# Patient Record
Sex: Female | Born: 1990
Health system: Southern US, Community
[De-identification: ages and names within clinical notes are randomized; demographics above are authoritative.]

## PROBLEM LIST (undated history)

## (undated) DIAGNOSIS — U071 COVID-19: Secondary | ICD-10-CM

## (undated) DIAGNOSIS — J1282 Pneumonia due to coronavirus disease 2019: Secondary | ICD-10-CM

## (undated) DIAGNOSIS — R011 Cardiac murmur, unspecified: Secondary | ICD-10-CM

## (undated) DIAGNOSIS — T781XXA Other adverse food reactions, not elsewhere classified, initial encounter: Secondary | ICD-10-CM

## (undated) DIAGNOSIS — J45909 Unspecified asthma, uncomplicated: Secondary | ICD-10-CM

## (undated) DIAGNOSIS — T7840XA Allergy, unspecified, initial encounter: Secondary | ICD-10-CM

## (undated) HISTORY — DX: Other adverse food reactions, not elsewhere classified, initial encounter: T78.1XXA

## (undated) HISTORY — DX: Cardiac murmur, unspecified: R01.1

## (undated) HISTORY — DX: Unspecified asthma, uncomplicated: J45.909

## (undated) HISTORY — PX: TONSILLECTOMY: SUR1361

## (undated) HISTORY — DX: Allergy, unspecified, initial encounter: T78.40XA

---
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
## 2011-01-30 LAB — COMPREHENSIVE METABOLIC PANEL
ALT: 11
AST: 23
Albumin: 3.9
Alkaline Phosphatase: 51
BUN: 7
CO2: 23
Calcium: 9.1
Chloride: 102
Creatinine, Ser: 0.83
Glucose, Bld: 94
Potassium: 3 — ABNORMAL LOW
Sodium: 135
Total Bilirubin: 0.4
Total Protein: 7.4

## 2011-01-30 LAB — URINE MICROSCOPIC-ADD ON

## 2011-01-30 LAB — URINALYSIS, ROUTINE W REFLEX MICROSCOPIC
Bilirubin Urine: NEGATIVE
Glucose, UA: NEGATIVE
Ketones, ur: 15 — AB
Leukocytes, UA: NEGATIVE
Nitrite: NEGATIVE
Protein, ur: NEGATIVE
Specific Gravity, Urine: 1.033 — ABNORMAL HIGH
Urobilinogen, UA: 0.2
pH: 5.5

## 2011-01-30 LAB — URINE CULTURE
Colony Count: NO GROWTH
Culture: NO GROWTH

## 2011-01-30 LAB — PREGNANCY, URINE: Preg Test, Ur: NEGATIVE

## 2011-01-30 LAB — LIPASE, BLOOD: Lipase: 16

## 2015-12-21 DIAGNOSIS — Z23 Encounter for immunization: Secondary | ICD-10-CM | POA: Diagnosis not present

## 2015-12-22 DIAGNOSIS — Z23 Encounter for immunization: Secondary | ICD-10-CM | POA: Diagnosis not present

## 2016-01-04 DIAGNOSIS — Z111 Encounter for screening for respiratory tuberculosis: Secondary | ICD-10-CM | POA: Diagnosis not present

## 2016-01-20 DIAGNOSIS — Z23 Encounter for immunization: Secondary | ICD-10-CM | POA: Diagnosis not present

## 2016-03-05 DIAGNOSIS — H903 Sensorineural hearing loss, bilateral: Secondary | ICD-10-CM | POA: Diagnosis not present

## 2016-03-05 DIAGNOSIS — S0300XA Dislocation of jaw, unspecified side, initial encounter: Secondary | ICD-10-CM | POA: Diagnosis not present

## 2016-03-05 DIAGNOSIS — H6983 Other specified disorders of Eustachian tube, bilateral: Secondary | ICD-10-CM | POA: Diagnosis not present

## 2016-03-07 DIAGNOSIS — Z23 Encounter for immunization: Secondary | ICD-10-CM | POA: Diagnosis not present

## 2016-04-06 DIAGNOSIS — J018 Other acute sinusitis: Secondary | ICD-10-CM | POA: Diagnosis not present

## 2016-04-25 DIAGNOSIS — J310 Chronic rhinitis: Secondary | ICD-10-CM | POA: Diagnosis not present

## 2016-05-11 ENCOUNTER — Ambulatory Visit: Payer: Self-pay | Admitting: Pediatrics

## 2016-05-17 ENCOUNTER — Ambulatory Visit: Payer: Self-pay | Admitting: Pediatrics

## 2016-06-19 DIAGNOSIS — J40 Bronchitis, not specified as acute or chronic: Secondary | ICD-10-CM | POA: Diagnosis not present

## 2017-03-04 DIAGNOSIS — R05 Cough: Secondary | ICD-10-CM | POA: Diagnosis not present

## 2017-03-04 DIAGNOSIS — J40 Bronchitis, not specified as acute or chronic: Secondary | ICD-10-CM | POA: Diagnosis not present

## 2017-03-04 DIAGNOSIS — Z6838 Body mass index (BMI) 38.0-38.9, adult: Secondary | ICD-10-CM | POA: Diagnosis not present

## 2017-06-15 DIAGNOSIS — Z91018 Allergy to other foods: Secondary | ICD-10-CM | POA: Diagnosis not present

## 2017-06-15 DIAGNOSIS — L500 Allergic urticaria: Secondary | ICD-10-CM | POA: Diagnosis not present

## 2017-06-27 ENCOUNTER — Ambulatory Visit: Payer: BLUE CROSS/BLUE SHIELD | Admitting: Physician Assistant

## 2017-06-27 ENCOUNTER — Encounter: Payer: Self-pay | Admitting: Physician Assistant

## 2017-06-27 VITALS — BP 139/89 | HR 69 | Temp 98.5°F | Ht 66.0 in | Wt 253.8 lb

## 2017-06-27 DIAGNOSIS — I499 Cardiac arrhythmia, unspecified: Secondary | ICD-10-CM | POA: Diagnosis not present

## 2017-06-27 DIAGNOSIS — T7840XA Allergy, unspecified, initial encounter: Secondary | ICD-10-CM

## 2017-06-27 DIAGNOSIS — Z0000 Encounter for general adult medical examination without abnormal findings: Secondary | ICD-10-CM | POA: Diagnosis not present

## 2017-06-27 DIAGNOSIS — H9190 Unspecified hearing loss, unspecified ear: Secondary | ICD-10-CM | POA: Insufficient documentation

## 2017-06-27 DIAGNOSIS — R002 Palpitations: Secondary | ICD-10-CM

## 2017-06-27 NOTE — Progress Notes (Signed)
BP 139/89   Pulse 69   Temp 98.5 F (36.9 C) (Oral)   Ht 5' 6"  (1.676 m)   Wt 253 lb 12.8 oz (115.1 kg)   LMP 05/27/2017   BMI 40.96 kg/m    Subjective:    Patient ID: Joan Ball, female    DOB: 11/23/1990, 27 y.o.   MRN: [id number]  HPI: Joan Ball is a 27 y.o. female presenting on 06/27/2017 for New Patient (Initial Visit) and Establish Care  Patient comes in as a new patient to be seen today.  She has a couple of medical issues that she is concerned about.  She most recently had a severe allergic reaction to peaches.  She has never been to the allergist to be tested.  But over the year she has found herself to have allergic sensitivities when stung fruit touches her or when she ingested.  This time peach cobbler was being bagged and other than with other bread.  She ate the bread and not the cobbler.  Approximately 3 hours later she had severe hives throughout her body.  She experiences some slight wheezing.  She took Benadryl at her home.  She then went to [facility] in [place].  This was on February 15.    She is also having a complaint of occasional palpitations.  She has never been evaluated by cardiology before.  She has had slightly elevated blood pressure in the past but never had to be treated.  She does have a strong family history of diabetes and heart disease.  Past Medical History:  Diagnosis Date  . Allergy   . Heart murmur   . Hypertension    Relevant past medical, surgical, family and social history reviewed and updated as indicated. Interim medical history since our last visit reviewed. Allergies and medications reviewed and updated. DATA REVIEWED: CHART IN EPIC  Family History reviewed for pertinent findings.  Review of Systems  Constitutional: Negative.  Negative for activity change, fatigue and fever.  HENT: Negative.   Eyes: Negative.   Respiratory: Negative.  Negative for cough.   Cardiovascular: Negative.  Negative for  chest pain.  Gastrointestinal: Negative.  Negative for abdominal pain.  Endocrine: Negative.   Genitourinary: Negative.  Negative for dysuria.  Musculoskeletal: Negative.   Skin: Negative.   Neurological: Negative.     Allergies as of 06/27/2017      Reactions   Other    Peaches    Peach [prunus Persica]    Amoxicillin Rash   Other Other   Penicillin G Rash   Other Other      Medication List    as of 06/27/2017  2:38 PM   You have not been prescribed any medications.        Objective:    BP 139/89   Pulse 69   Temp 98.5 F (36.9 C) (Oral)   Ht 5' 6"  (1.676 m)   Wt 253 lb 12.8 oz (115.1 kg)   LMP 05/27/2017   BMI 40.96 kg/m   Allergies  Allergen Reactions  . Other     Peaches   . Peach [Prunus Persica]   . Amoxicillin Rash    Other Other   . Penicillin G Rash    Other Other     Wt Readings from Last 3 Encounters:  06/27/17 253 lb 12.8 oz (115.1 kg)    Physical Exam  Constitutional: She is oriented to person, place, and time. She appears well-developed and well-nourished.  HENT:  Head: Normocephalic and atraumatic.  Right Ear: Tympanic membrane, external ear and ear canal normal.  Left Ear: Tympanic membrane, external ear and ear canal normal.  Nose: Nose normal. No rhinorrhea.  Mouth/Throat: Oropharynx is clear and moist and mucous membranes are normal. No oropharyngeal exudate or posterior oropharyngeal erythema.  Eyes: Conjunctivae and EOM are normal. Pupils are equal, round, and reactive to light.  Neck: Normal range of motion. Neck supple.  Cardiovascular: Normal rate, regular rhythm, normal heart sounds and intact distal pulses.  Sinus arrhythmia   Pulmonary/Chest: Effort normal and breath sounds normal.  Abdominal: Soft. Bowel sounds are normal.  Neurological: She is alert and oriented to person, place, and time. She has normal reflexes.  Skin: Skin is warm and dry. No rash noted.  Psychiatric: She has a normal mood and affect. Her  behavior is normal. Judgment and thought content normal.  Nursing note and vitals reviewed.   Results for orders placed or performed during the hospital encounter of 02/08/08  Urine culture  Result Value Ref Range   Specimen Description URINE, CLEAN CATCH    Special Requests NONE    Colony Count NO GROWTH    Culture NO GROWTH    Report Status 02/10/2008 FINAL   Urinalysis, Routine w reflex microscopic  Result Value Ref Range   Color, Urine YELLOW    APPearance TURBID (A)    Specific Gravity, Urine 1.033 (H)    pH 5.5    Glucose, UA NEGATIVE    Hgb urine dipstick MODERATE (A)    Bilirubin Urine NEGATIVE    Ketones, ur 15 (A)    Protein, ur NEGATIVE    Urobilinogen, UA 0.2    Nitrite NEGATIVE    Leukocytes, UA NEGATIVE   Urine microscopic-add on  Result Value Ref Range   Squamous Epithelial / LPF FEW (A)    WBC, UA 0-2    RBC / HPF 3-6    Bacteria, UA MANY (A)    Urine-Other MUCOUS PRESENT   Pregnancy, urine  Result Value Ref Range   Preg Test, Ur      NEGATIVE        THE SENSITIVITY OF THIS METHODOLOGY IS >24 mIU/mL  Comprehensive metabolic panel  Result Value Ref Range   Sodium 135    Potassium 3.0 (L)    Chloride 102    CO2 23    Glucose, Bld 94    BUN 7    Creatinine, Ser 0.83    Calcium 9.1    Total Protein 7.4    Albumin 3.9    AST 23    ALT 11    Alkaline Phosphatase 51    Total Bilirubin 0.4    GFR calc non Af Amer NOT CALCULATED    GFR calc Af Amer      NOT CALCULATED        The eGFR has been calculated using the MDRD equation. This calculation has not been validated in all clinical  Lipase, blood  Result Value Ref Range   Lipase 16       Assessment & Plan:   1. Allergic reaction, initial encounter - Ambulatory referral to Allergy  2. Cardiac arrhythmia, unspecified cardiac arrhythmia type - Ambulatory referral to Cardiology  3. Palpitations - Ambulatory referral to Cardiology  4. Hearing loss, unspecified hearing loss type,  unspecified laterality  5. Well adult exam - CBC with Differential/Platelet - CMP14+EGFR - Lipid panel - TSH   Continue all other maintenance medications  as listed above.  Follow up plan: Return in about 3 months (around 09/24/2017) for recheck.  Educational handout given for [place] PA-C [place] [address]  [place], [place] [address] [phone]   06/27/2017, 2:38 PM

## 2017-06-27 NOTE — Patient Instructions (Signed)
In a few days you may receive a survey in the mail or online from [employer] regarding your visit with us today. Please take a moment to fill this out. Your feedback is very important to our whole office. It can help us better understand your needs as well as improve your experience and satisfaction. Thank you for taking your time to complete it. We care about you.  Raiford Fetterman, PA-C  

## 2017-06-28 LAB — CMP14+EGFR
A/G RATIO: 1.4 (ref 1.2–2.2)
ALBUMIN: 4.1 g/dL (ref 3.5–5.5)
ALK PHOS: 79 IU/L (ref 39–117)
ALT: 13 IU/L (ref 0–32)
AST: 15 IU/L (ref 0–40)
BUN / CREAT RATIO: 9 (ref 9–23)
BUN: 7 mg/dL (ref 6–20)
CO2: 21 mmol/L (ref 20–29)
Calcium: 9.2 mg/dL (ref 8.7–10.2)
Chloride: 107 mmol/L — ABNORMAL HIGH (ref 96–106)
Creatinine, Ser: 0.76 mg/dL (ref 0.57–1.00)
GFR calc non Af Amer: 109 mL/min/{1.73_m2} (ref >59)
GFR, EST AFRICAN AMERICAN: 126 mL/min/{1.73_m2} (ref >59)
GLUCOSE: 84 mg/dL (ref 65–99)
Globulin, Total: 3 g/dL (ref 1.5–4.5)
POTASSIUM: 3.9 mmol/L (ref 3.5–5.2)
Sodium: 143 mmol/L (ref 134–144)
TOTAL PROTEIN: 7.1 g/dL (ref 6.0–8.5)

## 2017-06-28 LAB — CBC WITH DIFFERENTIAL/PLATELET
BASOS ABS: 0 10*3/uL (ref 0.0–0.2)
BASOS: 0 %
EOS (ABSOLUTE): 0.3 10*3/uL (ref 0.0–0.4)
Eos: 2 %
Hematocrit: 37.9 % (ref 34.0–46.6)
Hemoglobin: 12.1 g/dL (ref 11.1–15.9)
Immature Grans (Abs): 0 10*3/uL (ref 0.0–0.1)
Immature Granulocytes: 0 %
LYMPHS ABS: 2.6 10*3/uL (ref 0.7–3.1)
Lymphs: 24 %
MCH: 24 pg — ABNORMAL LOW (ref 26.6–33.0)
MCHC: 31.9 g/dL (ref 31.5–35.7)
MCV: 75 fL — AB (ref 79–97)
Monocytes Absolute: 0.6 10*3/uL (ref 0.1–0.9)
Monocytes: 6 %
Neutrophils Absolute: 7.3 10*3/uL — ABNORMAL HIGH (ref 1.4–7.0)
Neutrophils: 68 %
PLATELETS: 304 10*3/uL (ref 150–379)
RBC: 5.05 x10E6/uL (ref 3.77–5.28)
RDW: 16.2 % — ABNORMAL HIGH (ref 12.3–15.4)
WBC: 10.8 10*3/uL (ref 3.4–10.8)

## 2017-06-28 LAB — LIPID PANEL
CHOLESTEROL TOTAL: 173 mg/dL (ref 100–199)
Chol/HDL Ratio: 4.8 ratio — ABNORMAL HIGH (ref 0.0–4.4)
HDL: 36 mg/dL — ABNORMAL LOW (ref >39)
LDL Calculated: 103 mg/dL — ABNORMAL HIGH (ref 0–99)
Triglycerides: 171 mg/dL — ABNORMAL HIGH (ref 0–149)
VLDL CHOLESTEROL CAL: 34 mg/dL (ref 5–40)

## 2017-06-28 LAB — TSH: TSH: 3.72 u[IU]/mL (ref 0.450–4.500)

## 2017-07-03 NOTE — Progress Notes (Signed)
Cardiology Office Note   Date:  07/04/2017  ID:  Joan Ball, DOB [birth year], MRN [id number]  PCP:  Remus LofflerJones, Angel S, PA-C  Cardiologist:   No primary care provider on file. Referring:  Remus LofflerJones, Angel S, PA-C  Chief Complaint  Patient presents with  . Palpitations      History of Present Illness: Joan Ball is a 27 y.o. female who is referred by Remus LofflerJones, Angel S, PA-C for evaluation of palpitations. presents for evaluation of palpitations.   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  She does feel her heart skipping beats at times.  She will have a couple of beats in a row.  She does not describe any presyncope or  recent syncope.  She had one episode of syncope 2 years ago while sitting on a porch after having walked a little distance.  This was unexplained.  She does have episodes of her hands tingling at times.  This happens at night she notices it when she wakes up.  She rarely drinks caffeine now.  She does not notice any association with her palpitations with any activities or with food or beverage consumption.  She is starting to be a little more active.  She is raising a 26-year-old nephew.  She is active with him but does not exercise routinely.  She is not having any chest pressure, neck or arm discomfort.  She is not having any PND or orthopnea.  She will occasionally have a sensation like she has to breathe deeply.   Past Medical History:  Diagnosis Date  . Allergy   . Heart murmur     Past Surgical History:  Procedure Laterality Date  . TONSILLECTOMY       No current outpatient medications on file.   No current facility-administered medications for this visit.     Allergies:   Other; Peach [prunus persica]; Amoxicillin; and Penicillin g    Social History:  The patient  reports that  has never smoked. she has never used smokeless tobacco. She reports that she does not drink alcohol or use drugs.   Family  History:  The patient's family history includes Cancer in her maternal grandfather and maternal grandmother; Depression in her mother; Diabetes in her maternal grandfather and paternal grandfather; Fibromyalgia in her mother; Heart disease in her maternal grandfather, paternal grandfather, and paternal grandmother; Hyperlipidemia in her mother.    ROS:  Please see the history of present illness.   Otherwise, review of systems are positive for none.   All other systems are reviewed and negative.    PHYSICAL EXAM: VS:  BP 126/90 (BP Location: Left Arm, Patient Position: Sitting, Cuff Size: Normal)   Pulse (!) 53   Ht 5\' 6"  (1.676 m)   Wt 251 lb 6.4 oz (114 kg)   LMP 06/29/2017   BMI 40.58 kg/m  , BMI Body mass index is 40.58 kg/m. GENERAL:  Well appearing HEENT:  Pupils equal round and reactive, fundi not visualized, oral mucosa unremarkable NECK:  No jugular venous distention, waveform within normal limits, carotid upstroke brisk and symmetric, no bruits, no thyromegaly LYMPHATICS:  No cervical, inguinal adenopathy LUNGS:  Clear to auscultation bilaterally BACK:  No CVA tenderness CHEST:  Unremarkable HEART:  PMI not displaced or sustained,S1 and S2 within normal limits, no S3, no S4, no clicks, no rubs, no murmurs ABD:  Flat, positive bowel sounds normal in frequency in pitch, no bruits,  no rebound, no guarding, no midline pulsatile mass, no hepatomegaly, no splenomegaly EXT:  2 plus pulses throughout, no edema, no cyanosis no clubbing SKIN:  No rashes no nodules NEURO:  Cranial nerves II through XII grossly intact, motor grossly intact throughout PSYCH:  Cognitively intact, oriented to person place and time    EKG:  EKG is ordered today. The ekg ordered today demonstrates sinus rhythm with sinus arrhythmia, axis within normal limits, intervals within normal limits, no acute ST-T wave changes.   Recent Labs: 06/27/2017: ALT 13; BUN 7; Creatinine, Ser 0.76; Hemoglobin 12.1;  Platelets 304; Potassium 3.9; Sodium 143; TSH 3.720    Lipid Panel    Component Value Date/Time   CHOL 173 06/27/2017 1001   TRIG 171 (H) 06/27/2017 1001   HDL 36 (L) 06/27/2017 1001   CHOLHDL 4.8 (H) 06/27/2017 1001   LDLCALC 103 (H) 06/27/2017 1001      Wt Readings from Last 3 Encounters:  07/04/17 251 lb 6.4 oz (114 kg)  06/27/17 253 lb 12.8 oz (115.1 kg)      Other studies Reviewed: Additional studies/ records that were reviewed today include: labs. Review of the above records demonstrates:  Please see elsewhere in the note.     ASSESSMENT AND PLAN:  Palpitations:   The patient has sinus arrhythmia on her EKG.  She describes symptoms that might be PACs However, the patient does not have any suggestion of structural heart disease.. I did review labs and her TSH and electrolytes were normal this year.  These are particularly symptomatic with her.  Given this I do not think further cardiovascular testing is suggested and she would not want symptomatic treatment.  Obesity:  We had a long discussion about lifestyle and I gave her specific instructions about diet and exercise.  Current medicines are reviewed at length with the patient today.  The patient does not have concerns regarding medicines.  The following changes have been made:  no change  Labs/ tests ordered today include: None  Orders Placed This Encounter  Procedures  . EKG 12-Lead     Disposition:   FU with me as needed.     Signed, Rollene Rotunda, MD  07/04/2017 10:32 AM    [facility]

## 2017-07-04 ENCOUNTER — Encounter: Payer: Self-pay | Admitting: Cardiology

## 2017-07-04 ENCOUNTER — Ambulatory Visit: Payer: BLUE CROSS/BLUE SHIELD | Admitting: Cardiology

## 2017-07-04 VITALS — BP 126/90 | HR 53 | Ht 66.0 in | Wt 251.4 lb

## 2017-07-04 DIAGNOSIS — I499 Cardiac arrhythmia, unspecified: Secondary | ICD-10-CM | POA: Diagnosis not present

## 2017-07-04 NOTE — Patient Instructions (Signed)
Medication Instructions:  Continue current medications  If you need a refill on your cardiac medications before your next appointment, please call your pharmacy.  Labwork: None Ordered  Testing/Procedures: None Ordered  Follow-Up: Your physician wants you to follow-up in: As Needed.      Thank you for choosing [facility]!!       

## 2017-07-18 ENCOUNTER — Ambulatory Visit: Payer: BLUE CROSS/BLUE SHIELD | Admitting: Allergy & Immunology

## 2017-07-18 ENCOUNTER — Encounter: Payer: Self-pay | Admitting: Allergy & Immunology

## 2017-07-18 VITALS — BP 112/80 | HR 80 | Temp 98.3°F | Resp 16 | Ht 64.7 in | Wt 253.4 lb

## 2017-07-18 DIAGNOSIS — J3089 Other allergic rhinitis: Secondary | ICD-10-CM

## 2017-07-18 DIAGNOSIS — T7800XD Anaphylactic reaction due to unspecified food, subsequent encounter: Secondary | ICD-10-CM | POA: Diagnosis not present

## 2017-07-18 DIAGNOSIS — J302 Other seasonal allergic rhinitis: Secondary | ICD-10-CM | POA: Diagnosis not present

## 2017-07-18 DIAGNOSIS — T781XXS Other adverse food reactions, not elsewhere classified, sequela: Secondary | ICD-10-CM | POA: Diagnosis not present

## 2017-07-18 DIAGNOSIS — H1010 Acute atopic conjunctivitis, unspecified eye: Secondary | ICD-10-CM | POA: Diagnosis not present

## 2017-07-18 MED ORDER — EPINEPHRINE 0.3 MG/0.3ML IJ SOAJ: INTRAMUSCULAR | 1 refills | Status: DC

## 2017-07-18 MED ORDER — FLUTICASONE PROPIONATE 50 MCG/ACT NA SUSP: 2.0000 | Freq: Every day | NASAL | 5 refills | Status: DC

## 2017-07-18 NOTE — Patient Instructions (Addendum)
Allergic rhinitis - Cetirizine 10 mg once a day as needed for a runny nose - Flonase nasal spray. 2 sprays once a day as needed for a runny nose - Saline rinses as needed - If your symptoms are not controlled by medications you can consider allergy shots.  Food allergies - Avoid almond,and beef.  If you have an allergic reaction, give Benadryl 50 mg every 4 hours, and if you have life-threatening symptoms, inject with AuviQ 0.3mg .  - Action plan provided today - We are ordering blood tests today to help determine what foods you are allergic to. I will call you with results when they become available- usually in about 1 week.   Oral allergy syndrome The oral allergy syndrome (OAS) or pollen-food allergy syndrome (PFAS) is a relatively common form of food allergy, particularly in adults. It typically occurs in people who have pollen allergies when the immune system "sees" proteins on the food that look like proteins on the pollen. This results in the allergy antibody (IgE) binding to the food instead of the pollen. Patients typically report itching and/or mild swelling of the mouth and throat immediately following ingestion of certain uncooked fruits (including nuts) or raw vegetables. Only a very small number of affected individuals experience systemic allergic reactions, such as anaphylaxis which occurs with true food allergies.    Follow up in 6 months or sooner if needed

## 2017-07-18 NOTE — Progress Notes (Signed)
NEW PATIENT  Date of Service/Encounter:  07/19/17  Referring provider: Terald Sleeper, PA-C   Assessment:   Anaphylactic shock due to food  Seasonal and perennial allergic rhinoconjunctivitis (grasses, weeds, trees, indoor molds, outdoor molds, dust mites, cat, dog, tobacco)  Oral allergy syndrome    Seasonal Influenza Vaccine: yes   PPSV-23 Vaccine (CDC recommended for patients with persistent asthma 19-26yo): No    Plan/Recommendations:   Allergic rhinitis - Cetirizine 10 mg once a day as needed for a runny nose - Flonase nasal spray. 2 sprays once a day as needed for a runny nose - Saline rinses as needed - If your symptoms are not controlled by medications you can consider allergy shots.  Food allergies - Avoid almond,and beef.  If you have an allergic reaction, give Benadryl 50 mg every 4 hours, and if you have life-threatening symptoms, inject with AuviQ 0.61m.  - Action plan provided today - We are ordering blood tests today to help determine what foods you are allergic to. I will call you with results when they become available- usually in about 1 week.   Oral allergy syndrome The oral allergy syndrome (OAS) or pollen-food allergy syndrome (PFAS) is a relatively common form of food allergy, particularly in adults. It typically occurs in people who have pollen allergies when the immune system "sees" proteins on the food that look like proteins on the pollen. This results in the allergy antibody (IgE) binding to the food instead of the pollen. Patients typically report itching and/or mild swelling of the mouth and throat immediately following ingestion of certain uncooked fruits (including nuts) or raw vegetables. Only a very small number of affected individuals experience systemic allergic reactions, such as anaphylaxis which occurs with true food allergies.    Follow up in 6 months or sooner if needed    Subjective:   TAlissandra Geoffroyis a 27y.o. female  presenting today for evaluation of  Chief Complaint  Patient presents with  . Urticaria    ?peach allergy Jun 15, 2017    TDavid Towsonhas a history of the following: Patient Active Problem List   Diagnosis Date Noted  . Anaphylactic shock due to adverse food reaction 07/19/2017  . Seasonal allergic conjunctivitis 07/19/2017  . Seasonal and perennial allergic rhinitis 07/19/2017  . Oral allergy syndrome with ongoing reaction 07/19/2017  . Cardiac arrhythmia 06/27/2017  . Allergic reaction 06/27/2017  . Hearing loss 06/27/2017  . Palpitations 06/27/2017    History obtained from: chart review and patient interview.  TIvey Nembhardwas referred by JTerald Sleeper PA-C.     THilduris a 27y.o. female who presents to the clinic for a new patient visit. She reports a history of environmental allergies as well as allergy to fruit with a pit. She reports her environmental allergies as well controlled without the use of medications, however, occasionally when her dog licks her face she will break out in a fine red raised rask at the site that will resolve within an hour with no medical intervention. Her suspected food allergies are reported as being more problematic resulting in hives, cardiopulmonary and gastrointestinal symptoms.   Food Allergy Symptom History: Her history of food allergies began about 10 years ago she was eating a fresh peach and broke out in hives over her entire body which resolved with one dose of Benadryl. At that time she began to avoid peaches. About 5 or 6 years later, while she was walking a branch from  a peach tree hit her in the face after which both of her eyes swelled closed and took 2 hours to resolve. On June 15, 2017 she reports having biscuits that were cooked in the oven with a peach cobbler which resulted in full body hives and swelling in both hands. She took Benadryl which did not resolve the hives and then went to the emergency department where she  received additional Benadryl, ranitidine, and a steroid injection for resolution. She reports that most fruits with a pit can cause hives, however, this reaction can be intermittent. Foods that are problematic include peaches, cherries, almonds, mango, and apples. She reports she has not eaten red meat for the last 5 years because it gives her abdominal pain and diarrhea.  She does eat some fish and pork with seemingly no trouble.  She reports she frequently takes her nephew on hunting and fishing trips near [place] and the likelihood of a tick encounter is relatively high.   Allergic Rhinitis Symptom History: Abryana reports that during the spring season especially she begins to experience nasal congestion as well as runny nose and itchy eyes with some swelling.  She is not currently using medicated nasal sprays or saline nasal sprays nor is she taking antihistamines to relieve the symptoms.  During the spring, if her symptoms are flaring, she uses Zyrtec for relief.  She does use antihistamine eyedrops as needed with good relief.  Asthma: Tabatha reports she had asthma as a child and has used an inhaler as a child on a regular basis.  When she became a teenager she resides less heavily on the albuterol inhaler.  And now, as an adult, she uses an albuterol inhaler only during an upper respiratory infection.  She does report getting bronchitis about 2 times a year.  Cassandria Santee denies history including of eczema and allergy to stinging insects.  Otherwise, there is no history of other atopic diseases, including asthma, environmental allergies, stinging insect allergies, or urticaria. There is no significant infectious history. Vaccinations are up to date.    Past Medical History: Patient Active Problem List   Diagnosis Date Noted  . Anaphylactic shock due to adverse food reaction 07/19/2017  . Seasonal allergic conjunctivitis 07/19/2017  . Seasonal and perennial allergic rhinitis  07/19/2017  . Oral allergy syndrome with ongoing reaction 07/19/2017  . Cardiac arrhythmia 06/27/2017  . Allergic reaction 06/27/2017  . Hearing loss 06/27/2017  . Palpitations 06/27/2017    Medication List:  Allergies as of 07/18/2017      Reactions   Peach [prunus Persica]    Amoxicillin Rash   Other Other   Penicillin G Rash   Other Other      Medication List        Accurate as of 07/18/17 11:59 PM. Always use your most recent med list.          EPINEPHrine 0.3 mg/0.3 mL Soaj injection Commonly known as:  AUVI-Q Use as directed for severe allergic reaction   fluticasone 50 MCG/ACT nasal spray Commonly known as:  FLONASE Place 2 sprays into both nostrils daily.       Birth History: is non-contributory.   Developmental History: Deema has met all milestones on time. She has required no speech therapy, occupational therapy, or physical therapy.   Past Surgical History: Past Surgical History:  Procedure Laterality Date  . TONSILLECTOMY       Family History: Family History  Problem Relation Age of Onset  . Fibromyalgia Mother   .  Depression Mother   . Hyperlipidemia Mother   . Allergic rhinitis Mother   . Cancer Maternal Grandmother   . Cancer Maternal Grandfather   . Diabetes Maternal Grandfather   . Heart disease Maternal Grandfather   . Heart disease Paternal Grandmother   . Heart disease Paternal Grandfather   . Diabetes Paternal Grandfather   . Urticaria Neg Hx   . Eczema Neg Hx      Social/Environmental History: Jannelle lives in a house with her nephew, who she has recently gained custody of.  There is no concern for water damage or mildew in the home.  Flooring is carpeting throughout.  Heating is electric and cooling is central.  Animals located within the home include dog, cat, bird, and chameleon.  There are dogs located outside the home and in the bedroom.  There is no concern for broaches in the home and the bed is at least 2 feet off the  floor.  There are no plastic or dust mite free covers on the bed or pillows.  There is no concern for tobacco smoke in the home or the car.  There is no concern for exposure to fumes, chemicals, or dust.  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  She is a non-smoker.  Review of Systems: a 14-point review of systems is pertinent for what is mentioned in HPI.  Otherwise, all other systems were negative. Constitutional: negative other than that listed in the HPI Eyes: negative other than that listed in the HPI Ears, nose, mouth, throat, and face: negative other than that listed in the HPI Respiratory: negative other than that listed in the HPI Cardiovascular: negative other than that listed in the HPI Gastrointestinal: negative other than that listed in the HPI Genitourinary: negative other than that listed in the HPI Integument: negative other than that listed in the HPI Hematologic: negative other than that listed in the HPI Musculoskeletal: negative other than that listed in the HPI Neurological: negative other than that listed in the HPI Allergy/Immunologic: negative other than that listed in the HPI    Objective:   Blood pressure 112/80, pulse 80, temperature 98.3 F (36.8 C), temperature source Oral, resp. rate 16, height 5' 4.7" (1.643 m), weight 253 lb 6.4 oz (114.9 kg), last menstrual period 06/29/2017. Body mass index is 42.56 kg/m.   Physical Exam:  General: Alert, interactive, in no acute distress. Eyes: No conjunctival injection present on the right and No conjunctival injection present on the left. PERRL bilaterally. EOMI without pain. No photophobia.  Ears: Right TM pearly gray with normal light reflex, Left TM pearly gray with normal light reflex and Patent tympanostomy tube present on the left.  Nose/Throat: External nose within normal limits and septum midline. Turbinates minimally edematous without discharge. Posterior oropharynx mildly erythematous without  cobblestoning in the posterior oropharynx. Tonsils 2+ without exudates.  Tongue without thrush. Neck: Supple without thyromegaly. Trachea midline. Adenopathy: no enlarged lymph nodes appreciated in the anterior cervical, occipital, axillary, epitrochlear, inguinal, or popliteal regions. Lungs: Clear to auscultation without wheezing, rhonchi or rales. No increased work of breathing. CV: Normal S1/S2. No murmurs. Capillary refill <2 seconds.  Abdomen: Nondistended, nontender. No guarding or rebound tenderness. Bowel sounds present in all fields  Skin: Warm and dry, without lesions or rashes. Extremities:  No clubbing, cyanosis or edema. Neuro:   Grossly intact. No focal deficits appreciated. Responsive to questions.   Allergy Studies:   Indoor/Outdoor Percutaneous Adult Environmental Panel: positive to [employer]  grass, [place] grass, johnson grass, [place] blue grass, perennial rye grass, timothy grass, short ragweed, English plantain, rough pigweed, rough marsh elder, common mugwort, ash, birch, American beech, Box elder, eastern cottonwood, elm, hickory, maple, oak, pecan pollen, [place] sycamore, black walnut pollen, Cladosporium, Aspergillus, Penicillium, Mucor, Df mite, Dp mites, cat, dog and tobacco. Otherwise negative with adequate controls.  Selected Foods Panel: Positive to almond, beef, and negative to apple and peach with adequate controls.   Allergy testing results were read and interpreted by myself, documented by clinical staff.  Thank you for the opportunity to care for this patient.  Please do not hesitate to contact me with questions.  Gareth Morgan, FNP [facility]     I performed a history and physical examination of the patient and discussed her management with the Nurse Practitioner. I reviewed the Nurse Practitioner's note and agree with the documented findings and plan of care. The note in its entirety was edited by  myself, including the physical exam, assessment, and plan.    Salvatore Marvel, MD [facility]

## 2017-07-19 ENCOUNTER — Encounter: Payer: Self-pay | Admitting: Allergy & Immunology

## 2017-07-19 DIAGNOSIS — T781XXA Other adverse food reactions, not elsewhere classified, initial encounter: Secondary | ICD-10-CM | POA: Insufficient documentation

## 2017-07-19 DIAGNOSIS — T7800XA Anaphylactic reaction due to unspecified food, initial encounter: Secondary | ICD-10-CM | POA: Insufficient documentation

## 2017-07-19 DIAGNOSIS — J3089 Other allergic rhinitis: Secondary | ICD-10-CM

## 2017-07-19 DIAGNOSIS — J302 Other seasonal allergic rhinitis: Secondary | ICD-10-CM | POA: Insufficient documentation

## 2017-07-19 DIAGNOSIS — H1010 Acute atopic conjunctivitis, unspecified eye: Secondary | ICD-10-CM | POA: Insufficient documentation

## 2017-07-23 NOTE — Progress Notes (Signed)
Can you please call this patient and let her know that her alpha gal panel lab test was high in every category including pork, beef, and lamb and she should avoid these items in order to decrease the possibility of allergic reaction. Also, please remind her to carry her EpiPen in case of life threatening reaction. Thank you. The peach IgE is still pending.

## 2017-07-25 LAB — ALPHA-GAL PANEL
Alpha Gal IgE*: 25.5 kU/L — ABNORMAL HIGH (ref <0.10)
BEEF (BOS SPP) IGE: 23.4 kU/L — AB (ref <0.35)
Class Interpretation: 3
Class Interpretation: 4
Lamb/Mutton (Ovis spp) IgE: 12 kU/L — ABNORMAL HIGH (ref <0.35)
PORK (SUS SPP) IGE: 16.6 kU/L — AB (ref <0.35)
PORK CLASS INTERPRETATION: 3

## 2017-07-25 LAB — ALLERGEN PEACH F95: ALLERGEN PEACH F95: 3.76 kU/L — AB

## 2017-07-25 NOTE — Progress Notes (Signed)
Can you please let this patient know that her peach IgE was also high and she should avoid peaches and always have her EpiPen available. Thank you

## 2017-07-31 ENCOUNTER — Encounter: Payer: Self-pay | Admitting: Allergy & Immunology

## 2017-08-01 ENCOUNTER — Encounter: Payer: Self-pay | Admitting: *Deleted

## 2017-09-26 ENCOUNTER — Ambulatory Visit: Payer: BLUE CROSS/BLUE SHIELD | Admitting: Physician Assistant

## 2017-09-26 ENCOUNTER — Encounter: Payer: Self-pay | Admitting: Physician Assistant

## 2017-09-26 VITALS — BP 115/79 | HR 59 | Temp 98.6°F | Ht 64.7 in | Wt 248.6 lb

## 2017-09-26 DIAGNOSIS — J302 Other seasonal allergic rhinitis: Secondary | ICD-10-CM | POA: Diagnosis not present

## 2017-09-26 DIAGNOSIS — Z91018 Allergy to other foods: Secondary | ICD-10-CM

## 2017-09-26 DIAGNOSIS — J3089 Other allergic rhinitis: Secondary | ICD-10-CM | POA: Diagnosis not present

## 2017-09-26 NOTE — Patient Instructions (Signed)
In a few days you may receive a survey in the mail or online from [employer] regarding your visit with us today. Please take a moment to fill this out. Your feedback is very important to our whole office. It can help us better understand your needs as well as improve your experience and satisfaction. Thank you for taking your time to complete it. We care about you.  Moises Terpstra, PA-C  

## 2017-09-27 ENCOUNTER — Ambulatory Visit: Payer: BLUE CROSS/BLUE SHIELD | Admitting: Physician Assistant

## 2017-09-30 DIAGNOSIS — Z91018 Allergy to other foods: Secondary | ICD-10-CM | POA: Insufficient documentation

## 2017-09-30 NOTE — Progress Notes (Signed)
BP 115/79   Pulse (!) 59   Temp 98.6 F (37 C) (Oral)   Ht 5' 4.7" (1.643 m)   Wt 248 lb 9.6 oz (112.8 kg)   BMI 41.75 kg/m    Subjective:    Patient ID: Joan Ball, female    DOB: 1990/08/18, 27 y.o.   MRN: [id number]  HPI: Joan Adamabitha Footman is a 27 y.o. female presenting on 09/26/2017 for Discuss allergies  In the past month patient was seen by allergy and was determined to have severe allergy to peach and alpha gal.  She has made tremendous dietary changes.  She has a another family member with alpha gal allergy so that is helping the whole family.  She is using her allergy medications as given by the allergist and will continue to follow-up with him.  Past Medical History:  Diagnosis Date  . Allergy   . Asthma    as a child  . Heart murmur    Relevant past medical, surgical, family and social history reviewed and updated as indicated. Interim medical history since our last visit reviewed. Allergies and medications reviewed and updated. DATA REVIEWED: CHART IN EPIC  Family History reviewed for pertinent findings.  Review of Systems  Constitutional: Negative.   HENT: Negative.   Eyes: Negative.   Respiratory: Negative.   Gastrointestinal: Negative.   Genitourinary: Negative.     Allergies as of 09/26/2017      Reactions   Peach [prunus Persica]    Amoxicillin Rash   Other Other   Penicillin G Rash   Other Other      Medication List        Accurate as of 09/26/17 11:59 PM. Always use your most recent med list.          EPINEPHrine 0.3 mg/0.3 mL Soaj injection Commonly known as:  AUVI-Q Use as directed for severe allergic reaction   fluticasone 50 MCG/ACT nasal spray Commonly known as:  FLONASE Place 2 sprays into both nostrils daily.          Objective:    BP 115/79   Pulse (!) 59   Temp 98.6 F (37 C) (Oral)   Ht 5' 4.7" (1.643 m)   Wt 248 lb 9.6 oz (112.8 kg)   BMI 41.75 kg/m   Allergies  Allergen Reactions  . Peach [Prunus  Persica]   . Amoxicillin Rash    Other Other   . Penicillin G Rash    Other Other     Wt Readings from Last 3 Encounters:  09/26/17 248 lb 9.6 oz (112.8 kg)  07/18/17 253 lb 6.4 oz (114.9 kg)  07/04/17 251 lb 6.4 oz (114 kg)    Physical Exam  Constitutional: She is oriented to person, place, and time. She appears well-developed and well-nourished.  HENT:  Head: Normocephalic and atraumatic.  Eyes: Pupils are equal, round, and reactive to light. Conjunctivae and EOM are normal.  Cardiovascular: Normal rate, regular rhythm, normal heart sounds and intact distal pulses.  Pulmonary/Chest: Effort normal and breath sounds normal.  Abdominal: Soft. Bowel sounds are normal.  Neurological: She is alert and oriented to person, place, and time. She has normal reflexes.  Skin: Skin is warm and dry. No rash noted.  Psychiatric: She has a normal mood and affect. Her behavior is normal. Judgment and thought content normal.    Results for orders placed or performed in visit on 07/18/17  Alpha-Gal Panel  Result Value Ref Range   Beef (Bos spp)  IgE 23.40 (H) <0.35 kU/L   Class Interpretation 4    Lamb/Mutton (Ovis spp) IgE 12.00 (H) <0.35 kU/L   Class Interpretation 3    Pork (Sus spp) IgE 16.60 (H) <0.35 kU/L   Class Interpretation 3    Alpha Gal IgE* 25.50 (H) <0.10 kU/L  Allergen Peach f95  Result Value Ref Range   Allergen, Peach f95 3.76 (A) Class III kU/L      Assessment & Plan:   1. Allergy to alpha-gal Dietary measures  2. Seasonal and perennial allergic rhinitis flonase Limit exposure  Continue all other maintenance medications as listed above.  Follow up plan: Return if symptoms worsen or fail to improve.  Educational handout given for survey  Remus Loffler PA-C [facility] [address]  [place], [place] [address] [phone]   09/30/2017, 1:39 PM

## 2018-01-03 ENCOUNTER — Encounter: Payer: Self-pay | Admitting: Physician Assistant

## 2018-01-03 ENCOUNTER — Ambulatory Visit: Payer: BLUE CROSS/BLUE SHIELD | Admitting: Physician Assistant

## 2018-01-03 VITALS — BP 127/63 | HR 71 | Temp 97.8°F | Ht 64.7 in | Wt 241.4 lb

## 2018-01-03 DIAGNOSIS — J302 Other seasonal allergic rhinitis: Secondary | ICD-10-CM | POA: Diagnosis not present

## 2018-01-03 DIAGNOSIS — J209 Acute bronchitis, unspecified: Secondary | ICD-10-CM

## 2018-01-03 DIAGNOSIS — J3089 Other allergic rhinitis: Secondary | ICD-10-CM | POA: Diagnosis not present

## 2018-01-03 MED ORDER — AZELASTINE HCL 0.1 % NA SOLN: 1.0000 | Freq: Two times a day (BID) | NASAL | 11 refills | Status: DC

## 2018-01-03 MED ORDER — PREDNISONE 10 MG (21) PO TBPK: ORAL_TABLET | ORAL | 0 refills | Status: DC

## 2018-01-03 MED ORDER — AZITHROMYCIN 250 MG PO TABS: ORAL_TABLET | ORAL | 2 refills | Status: DC

## 2018-01-03 MED ORDER — ALBUTEROL SULFATE HFA 108 (90 BASE) MCG/ACT IN AERS: 2.0000 | INHALATION_SPRAY | Freq: Four times a day (QID) | RESPIRATORY_TRACT | 0 refills | Status: AC | PRN

## 2018-01-06 NOTE — Progress Notes (Signed)
BP 127/63   Pulse 71   Temp 97.8 F (36.6 C) (Oral)   Ht 5' 4.7" (1.643 m)   Wt 241 lb 6.4 oz (109.5 kg)   BMI 40.54 kg/m    Subjective:    Patient ID: Joan Ball, female    DOB: 09-20-1990, 27 y.o.   MRN: [id number]  HPI: Joan Ball is a 27 y.o. female presenting on 01/03/2018 for Nasal Congestion  Patient with several days of progressing upper respiratory and bronchial symptoms. Initially there was more upper respiratory congestion. This progressed to having significant cough that is productive throughout the day and severe at night. There is occasional wheezing after coughing. Sometimes there is slight dyspnea on exertion. It is productive mucus that is yellow in color. Denies any blood.   Past Medical History:  Diagnosis Date  . Allergy   . Asthma    as a child  . Heart murmur    Relevant past medical, surgical, family and social history reviewed and updated as indicated. Interim medical history since our last visit reviewed. Allergies and medications reviewed and updated. DATA REVIEWED: CHART IN EPIC  Family History reviewed for pertinent findings.  Review of Systems  Constitutional: Positive for chills and fatigue. Negative for activity change and appetite change.  HENT: Positive for congestion, postnasal drip and sore throat.   Eyes: Negative.   Respiratory: Positive for cough and wheezing.   Cardiovascular: Negative.  Negative for chest pain, palpitations and leg swelling.  Gastrointestinal: Negative.   Genitourinary: Negative.   Musculoskeletal: Negative.   Skin: Negative.   Neurological: Positive for headaches.    Allergies as of 01/03/2018      Reactions   Peach [prunus Persica]    Amoxicillin Rash   Other Other   Penicillin G Rash   Other Other      Medication List        Accurate as of 01/03/18 11:59 PM. Always use your most recent med list.          albuterol 108 (90 Base) MCG/ACT inhaler Commonly known as:  PROVENTIL HFA;VENTOLIN  HFA Inhale 2 puffs into the lungs every 6 (six) hours as needed for wheezing or shortness of breath.   azelastine 0.1 % nasal spray Commonly known as:  ASTELIN Place 1 spray into both nostrils 2 (two) times daily. Use in each nostril as directed   azithromycin 250 MG tablet Commonly known as:  ZITHROMAX Take as directed   EPINEPHrine 0.3 mg/0.3 mL Soaj injection Commonly known as:  EPI-PEN Use as directed for severe allergic reaction   predniSONE 10 MG (21) Tbpk tablet Commonly known as:  STERAPRED UNI-PAK 21 TAB As directed x 6 days          Objective:    BP 127/63   Pulse 71   Temp 97.8 F (36.6 C) (Oral)   Ht 5' 4.7" (1.643 m)   Wt 241 lb 6.4 oz (109.5 kg)   BMI 40.54 kg/m   Allergies  Allergen Reactions  . Peach [Prunus Persica]   . Amoxicillin Rash    Other Other   . Penicillin G Rash    Other Other     Wt Readings from Last 3 Encounters:  01/03/18 241 lb 6.4 oz (109.5 kg)  09/26/17 248 lb 9.6 oz (112.8 kg)  07/18/17 253 lb 6.4 oz (114.9 kg)    Physical Exam  Constitutional: She is oriented to person, place, and time. She appears well-developed and well-nourished.  HENT:  Head:  Normocephalic and atraumatic.  Right Ear: There is drainage and tenderness.  Left Ear: There is drainage and tenderness.  Nose: Mucosal edema and rhinorrhea present. Right sinus exhibits no maxillary sinus tenderness and no frontal sinus tenderness. Left sinus exhibits no maxillary sinus tenderness and no frontal sinus tenderness.  Mouth/Throat: Oropharyngeal exudate and posterior oropharyngeal erythema present.  Eyes: Pupils are equal, round, and reactive to light. Conjunctivae and EOM are normal.  Neck: Normal range of motion. Neck supple.  Cardiovascular: Normal rate, regular rhythm, normal heart sounds and intact distal pulses.  Pulmonary/Chest: Effort normal. She has wheezes in the right upper field and the left upper field.  Abdominal: Soft. Bowel sounds are normal.   Neurological: She is alert and oriented to person, place, and time. She has normal reflexes.  Skin: Skin is warm and dry. No rash noted.  Psychiatric: She has a normal mood and affect. Her behavior is normal. Judgment and thought content normal.    Results for orders placed or performed in visit on 07/18/17  Alpha-Gal Panel  Result Value Ref Range   Beef (Bos spp) IgE 23.40 (H) <0.35 kU/L   Class Interpretation 4    Lamb/Mutton (Ovis spp) IgE 12.00 (H) <0.35 kU/L   Class Interpretation 3    Pork (Sus spp) IgE 16.60 (H) <0.35 kU/L   Class Interpretation 3    Alpha Gal IgE* 25.50 (H) <0.10 kU/L  Allergen Peach f95  Result Value Ref Range   Allergen, Peach f95 3.76 (A) Class III kU/L      Assessment & Plan:   1. Acute bronchitis, unspecified organism - albuterol (PROVENTIL HFA;VENTOLIN HFA) 108 (90 Base) MCG/ACT inhaler; Inhale 2 puffs into the lungs every 6 (six) hours as needed for wheezing or shortness of breath.  Dispense: 1 Inhaler; Refill: 0 - azithromycin (ZITHROMAX Z-PAK) 250 MG tablet; Take as directed  Dispense: 6 each; Refill: 2 - predniSONE (STERAPRED UNI-PAK 21 TAB) 10 MG (21) TBPK tablet; As directed x 6 days  Dispense: 21 tablet; Refill: 0  2. Seasonal and perennial allergic rhinitis - azelastine (ASTELIN) 0.1 % nasal spray; Place 1 spray into both nostrils 2 (two) times daily. Use in each nostril as directed  Dispense: 30 mL; Refill: 11   Continue all other maintenance medications as listed above.  Follow up plan: No follow-ups on file.  Educational handout given for survey  Remus Loffler PA-C [facility] [address]  [place], [place] [address] [phone]   01/06/2018, 9:43 AM

## 2018-07-01 ENCOUNTER — Ambulatory Visit (INDEPENDENT_AMBULATORY_CARE_PROVIDER_SITE_OTHER): Payer: BLUE CROSS/BLUE SHIELD

## 2018-07-01 ENCOUNTER — Encounter: Payer: Self-pay | Admitting: Physician Assistant

## 2018-07-01 ENCOUNTER — Ambulatory Visit: Payer: BLUE CROSS/BLUE SHIELD | Admitting: Physician Assistant

## 2018-07-01 VITALS — BP 124/84 | HR 70 | Temp 98.0°F | Ht 64.7 in | Wt 240.4 lb

## 2018-07-01 DIAGNOSIS — M545 Low back pain, unspecified: Secondary | ICD-10-CM

## 2018-07-01 MED ORDER — PREDNISONE 10 MG (48) PO TBPK: ORAL_TABLET | ORAL | 0 refills | Status: DC

## 2018-07-01 MED ORDER — METAXALONE 800 MG PO TABS: 800.0000 mg | ORAL_TABLET | Freq: Three times a day (TID) | ORAL | 0 refills | Status: DC

## 2018-07-01 NOTE — Progress Notes (Signed)
BP 124/84   Pulse 70   Temp 98 F (36.7 C) (Oral)   Ht 5' 4.7" (1.643 m)   Wt 240 lb 6.4 oz (109 kg)   BMI 40.38 kg/m    Subjective:    Patient ID: Joan Ball, female    DOB: November 13, 1990, 28 y.o.   MRN: [id number]  HPI: Joan Ball is a 28 y.o. female presenting on 07/01/2018 for Back Pain (started last Thursday )  This patient comes in today having about 5days very low lumbar back pain.  It radiates over to the right sciatic area.  She does not recall any specific injury.  She remembers waking up and having a lot of pain in her back in no position she can get in was beneficial.  She has had no radiation of it down her leg or weakness in her leg.  She does have a very strong family history of degenerative disc disease.  She states that her brother has already had several surgeries and he is in his early 66s.  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  We have discussed her not doing all calls for little bit but her back is feeling better and to try to lift as little as possible at work.  Past Medical History:  Diagnosis Date  . Allergy   . Asthma    as a child  . Heart murmur    Relevant past medical, surgical, family and social history reviewed and updated as indicated. Interim medical history since our last visit reviewed. Allergies and medications reviewed and updated. DATA REVIEWED: CHART IN EPIC  Family History reviewed for pertinent findings.  Review of Systems  Constitutional: Negative.   HENT: Negative.   Eyes: Negative.   Respiratory: Negative.   Gastrointestinal: Negative.   Genitourinary: Negative.   Musculoskeletal: Positive for arthralgias, back pain, gait problem and myalgias.    Allergies as of 07/01/2018      Reactions   Advil [ibuprofen]    Hives   Peach [prunus Persica]    Amoxicillin Rash   Other Other   Penicillin G Rash    Other Other      Medication List       Accurate as of July 01, 2018  8:26 AM. Always use your most recent med list.        albuterol 108 (90 Base) MCG/ACT inhaler Commonly known as:  PROVENTIL HFA;VENTOLIN HFA Inhale 2 puffs into the lungs every 6 (six) hours as needed for wheezing or shortness of breath.   azelastine 0.1 % nasal spray Commonly known as:  ASTELIN Place 1 spray into both nostrils 2 (two) times daily. Use in each nostril as directed   EPINEPHrine 0.3 mg/0.3 mL Soaj injection Commonly known as:  AUVI-Q Use as directed for severe allergic reaction   metaxalone 800 MG tablet Commonly known as:  SKELAXIN Take 1 tablet (800 mg total) by mouth 3 (three) times daily.   predniSONE 10 MG (48) Tbpk tablet Commonly known as:  STERAPRED UNI-PAK 48 TAB Take as directed for 12 days          Objective:    BP 124/84   Pulse 70   Temp 98 F (36.7 C) (Oral)   Ht 5' 4.7" (1.643 m)   Wt 240 lb 6.4 oz (109 kg)   BMI 40.38 kg/m  Allergies  Allergen Reactions  . Advil [Ibuprofen]     Hives   . Peach [Prunus Persica]   . Amoxicillin Rash    Other Other   . Penicillin G Rash    Other Other     Wt Readings from Last 3 Encounters:  07/01/18 240 lb 6.4 oz (109 kg)  01/03/18 241 lb 6.4 oz (109.5 kg)  09/26/17 248 lb 9.6 oz (112.8 kg)    Physical Exam Constitutional:      Appearance: She is well-developed.  HENT:     Head: Normocephalic and atraumatic.  Eyes:     Conjunctiva/sclera: Conjunctivae normal.     Pupils: Pupils are equal, round, and reactive to light.  Cardiovascular:     Rate and Rhythm: Normal rate and regular rhythm.     Heart sounds: Normal heart sounds.  Pulmonary:     Effort: Pulmonary effort is normal.     Breath sounds: Normal breath sounds.  Musculoskeletal:     Lumbar back: She exhibits decreased range of motion, tenderness, pain and spasm.       Back:  Skin:    General: Skin is warm and dry.     Findings: No rash.   Neurological:     Mental Status: She is alert and oriented to person, place, and time.     Deep Tendon Reflexes: Reflexes are normal and symmetric.         Assessment & Plan:   1. Lumbar pain - predniSONE (STERAPRED UNI-PAK 48 TAB) 10 MG (48) TBPK tablet; Take as directed for 12 days  Dispense: 48 tablet; Refill: 0 - metaxalone (SKELAXIN) 800 MG tablet; Take 1 tablet (800 mg total) by mouth 3 (three) times daily.  Dispense: 60 tablet; Refill: 0 - DG Lumbar Spine 2-3 Views; Future   Continue all other maintenance medications as listed above.  Follow up plan: No follow-ups on file.  Educational handout given for back exercises  Remus Loffler PA-C [facility] [address]  [place], [place] [address] [phone]   07/01/2018, 8:26 AM

## 2018-07-01 NOTE — Patient Instructions (Signed)
Herniated Disk Rehab  Ask your health care provider which exercises are safe for you. Do exercises exactly as told by your health care provider and adjust them as directed. It is normal to feel mild stretching, pulling, tightness, or discomfort as you do these exercises, but you should stop right away if you feel sudden pain or your pain gets worse.Do not begin these exercises until told by your health care provider.  Stretching and range of motion exercises  These exercises warm up your muscles and joints and improve the movement and flexibility of your back. These exercises also help to relieve pain, numbness, and tingling.  Exercise A: Prone extension on elbows    1. Lie on your abdomen on a firm surface.  2. Prop yourself up on your elbows.  3. Use your arms to help lift your chest up until you feel a gentle stretch in your abdomen and your lower back.  ? This will place some of your body weight on your elbows. If this is uncomfortable, try stacking pillows under your chest.  ? Your hips should stay down, against the surface that you are lying on. Keep your hip and back muscles relaxed.  4. Hold for __________ seconds.  5. Slowly relax your upper body and return to the starting position.  Repeat __________ times. Complete this exercise __________ times a day.  Exercise B: Standing extension  1. Stand with your feet shoulder width apart.  2. Place your hands on your lower back, with your palms on your back.  3. Gently arch your back and press forward with your hands.  4. Hold for __________ seconds.  5. Slowly return to the starting position.  Repeat __________ times. Complete this exercise__________ times a day.  Strengthening exercises  These exercises build strength and endurance in your back. Endurance is the ability to use your muscles for a long time, even after they get tired.  Exercise C: Pelvic tilt  1. Lie on your back on a firm surface. Bend your knees and keep your feet flat.  2. Tense your abdominal  muscles. Tip your pelvis up toward the ceiling and flatten your lower back into the floor.  ? To help with this exercise, you may place a small towel under your lower back and try to push your back into the towel.  3. Hold for __________ seconds.  4. Let your muscles relax completely before you repeat this exercise.  Repeat __________ times. Complete this exercise __________ times a day.  Exercise D: Alternating arm and leg raises    1. Get on your hands and knees on a firm surface. If you are on a hard floor, you may want to use padding to cushion your knees, such as an exercise mat.  2. Line up your arms and legs. Your hands should be below your shoulders, and your knees should be below your hips.  3. Lift your left leg behind you. At the same time, raise your right arm and straighten it in front of you.  ? Do not lift your leg higher than your hip.  ? Do not lift your arm higher than your shoulder.  ? Keep your abdominal and back muscles tight.  ? Keep your hips facing the ground.  ? Do not arch your back.  ? Keep your balance carefully, and do not hold your breath.  4. Hold for __________ seconds.  5. Slowly return to the starting position and repeat with your right leg and your left   arm.  Repeat __________ times. Complete this exercise __________ times a day.  Exercise E: Bridge    1. Lie on your back on a firm surface with your knees bent and your feet flat on the floor.  2. Tighten your abdominal and buttocks muscles and lift your bottom and abdomen off the floor until your trunk is level with your thighs.  ? You should feel the muscles working in your buttocks and the back of your thighs.  ? Do not arch your back.  ? If this exercise is too easy, try doing it with your arms crossed over your chest.  3. Hold this position for __________ seconds.  4. Slowly lower your hips to the starting position.  5. Let your muscles relax completely between repetitions.  Repeat __________ times. Complete this exercise  __________ times a day.  This information is not intended to replace advice given to you by your health care provider. Make sure you discuss any questions you have with your health care provider.  Document Released: 04/16/2005 Document Revised: 12/22/2015 Document Reviewed: 02/01/2015  Elsevier Interactive Patient Education  2019 [employer]

## 2018-07-02 ENCOUNTER — Other Ambulatory Visit: Payer: Self-pay | Admitting: *Deleted

## 2018-07-02 ENCOUNTER — Telehealth: Payer: Self-pay | Admitting: Physician Assistant

## 2018-07-02 DIAGNOSIS — M545 Low back pain, unspecified: Secondary | ICD-10-CM

## 2018-07-02 MED ORDER — PREDNISONE 10 MG (48) PO TBPK: ORAL_TABLET | ORAL | 0 refills | Status: DC

## 2018-07-02 MED ORDER — METAXALONE 800 MG PO TABS: 800.0000 mg | ORAL_TABLET | Freq: Three times a day (TID) | ORAL | 0 refills | Status: DC

## 2018-07-02 NOTE — Telephone Encounter (Signed)
Aware.  Resent medications .

## 2019-01-14 ENCOUNTER — Telehealth: Payer: Self-pay | Admitting: Physician Assistant

## 2019-01-14 NOTE — Telephone Encounter (Signed)
Printed shot record off ncir and faxed it to pt

## 2019-01-16 ENCOUNTER — Ambulatory Visit (INDEPENDENT_AMBULATORY_CARE_PROVIDER_SITE_OTHER): Payer: BLUE CROSS/BLUE SHIELD | Admitting: Physician Assistant

## 2019-01-16 DIAGNOSIS — Z91018 Allergy to other foods: Secondary | ICD-10-CM | POA: Diagnosis not present

## 2019-01-16 DIAGNOSIS — R42 Dizziness and giddiness: Secondary | ICD-10-CM

## 2019-01-16 MED ORDER — MECLIZINE HCL 25 MG PO TABS: 25.0000 mg | ORAL_TABLET | Freq: Three times a day (TID) | ORAL | 5 refills | Status: AC | PRN

## 2019-01-16 NOTE — Progress Notes (Signed)
10  9/18 11:19      Telephone visit  Subjective: CC: Allergies PCP: Terald Sleeper, PA-C [id number] Eng is a 28 y.o. female calls for telephone consult today. Patient provides verbal consent for consult held via phone.  Patient is identified with 2 separate identifiers.  At this time the entire area is on COVID-19 social distancing and stay home orders are in place.  Patient is of higher risk and therefore we are performing this by a virtual method.  Location of patient: Home Location of provider: [facility] Others present for call: no  We have had a visit for the patient's chronic allergy issues.  She had a anaphylactic reaction to food, stone fruit.  She also has alpha gal allergy.  Her test was positive.  She is doing a job where she avoids all of this.  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  She had to take a break because of some life things are happening with her family.  But she hopes to go back soon.  She has asked for a note to be written for not to have any other updated vaccines because of her cough gal allergy.  She will also have some dizziness at times.  And she has taken Antivert past.   ROS: Per HPI  Allergies  Allergen Reactions  . Advil [Ibuprofen]     Hives   . Peach [Prunus Persica]   . Amoxicillin Rash    Other Other   . Penicillin G Rash    Other Other    Past Medical History:  Diagnosis Date  . Allergy   . Asthma    as a child  . Heart murmur     Current Outpatient Medications:  .  albuterol (PROVENTIL HFA;VENTOLIN HFA) 108 (90 Base) MCG/ACT inhaler, Inhale 2 puffs into the lungs every 6 (six) hours as needed for wheezing or shortness of breath., Disp: 1 Inhaler, Rfl: 0 .  azelastine (ASTELIN) 0.1 % nasal spray, Place 1 spray into both nostrils 2 (two) times daily. Use in each nostril as directed, Disp: 30 mL, Rfl: 11 .  EPINEPHrine (AUVI-Q) 0.3 mg/0.3 mL IJ SOAJ injection, Use as directed for severe allergic reaction, Disp: 2  Device, Rfl: 1 .  meclizine (ANTIVERT) 25 MG tablet, Take 1 tablet (25 mg total) by mouth 3 (three) times daily as needed for dizziness or nausea., Disp: 60 tablet, Rfl: 5 .  metaxalone (SKELAXIN) 800 MG tablet, Take 1 tablet (800 mg total) by mouth 3 (three) times daily., Disp: 60 tablet, Rfl: 0 .  predniSONE (STERAPRED UNI-PAK 48 TAB) 10 MG (48) TBPK tablet, Take as directed for 12 days, Disp: 48 tablet, Rfl: 0  Assessment/ Plan: 28 y.o. female   1. Allergy to alpha-gal Avoidance measures No vaccine  2. Allergy to food Avoidance measures  3. Dizziness - meclizine (ANTIVERT) 25 MG tablet; Take 1 tablet (25 mg total) by mouth 3 (three) times daily as needed for dizziness or nausea.  Dispense: 60 tablet; Refill: 5   No follow-ups on file.  Continue all other maintenance medications as listed above.  Start time: 11:19 AM End time: 11:39 AM  Meds ordered this encounter  Medications  . meclizine (ANTIVERT) 25 MG tablet    Sig: Take 1 tablet (25 mg total) by mouth 3 (three) times daily as needed for dizziness or nausea.    Dispense:  60 tablet    Refill:  5    Order Specific Question:  Supervising Provider    Answer:   Janora Norlander [id number]    Particia Nearing PA-C [place] [phone]

## 2019-01-19 ENCOUNTER — Encounter: Payer: Self-pay | Admitting: Physician Assistant

## 2019-07-01 DIAGNOSIS — F4321 Adjustment disorder with depressed mood: Secondary | ICD-10-CM | POA: Diagnosis not present

## 2019-07-08 DIAGNOSIS — F4321 Adjustment disorder with depressed mood: Secondary | ICD-10-CM | POA: Diagnosis not present

## 2019-07-15 DIAGNOSIS — F4321 Adjustment disorder with depressed mood: Secondary | ICD-10-CM | POA: Diagnosis not present

## 2019-07-22 DIAGNOSIS — F4321 Adjustment disorder with depressed mood: Secondary | ICD-10-CM | POA: Diagnosis not present

## 2019-07-29 DIAGNOSIS — F4321 Adjustment disorder with depressed mood: Secondary | ICD-10-CM | POA: Diagnosis not present

## 2019-08-05 DIAGNOSIS — F4321 Adjustment disorder with depressed mood: Secondary | ICD-10-CM | POA: Diagnosis not present

## 2019-08-12 DIAGNOSIS — F4321 Adjustment disorder with depressed mood: Secondary | ICD-10-CM | POA: Diagnosis not present

## 2019-08-14 DIAGNOSIS — Z20828 Contact with and (suspected) exposure to other viral communicable diseases: Secondary | ICD-10-CM | POA: Diagnosis not present

## 2019-08-19 DIAGNOSIS — F4321 Adjustment disorder with depressed mood: Secondary | ICD-10-CM | POA: Diagnosis not present

## 2019-08-26 DIAGNOSIS — F4321 Adjustment disorder with depressed mood: Secondary | ICD-10-CM | POA: Diagnosis not present

## 2019-09-02 DIAGNOSIS — F4321 Adjustment disorder with depressed mood: Secondary | ICD-10-CM | POA: Diagnosis not present

## 2019-09-09 DIAGNOSIS — F4321 Adjustment disorder with depressed mood: Secondary | ICD-10-CM | POA: Diagnosis not present

## 2019-09-16 DIAGNOSIS — F4321 Adjustment disorder with depressed mood: Secondary | ICD-10-CM | POA: Diagnosis not present

## 2019-09-23 DIAGNOSIS — F4321 Adjustment disorder with depressed mood: Secondary | ICD-10-CM | POA: Diagnosis not present

## 2019-10-14 DIAGNOSIS — F4321 Adjustment disorder with depressed mood: Secondary | ICD-10-CM | POA: Diagnosis not present

## 2019-10-21 DIAGNOSIS — F4321 Adjustment disorder with depressed mood: Secondary | ICD-10-CM | POA: Diagnosis not present

## 2019-10-28 DIAGNOSIS — F4321 Adjustment disorder with depressed mood: Secondary | ICD-10-CM | POA: Diagnosis not present

## 2019-11-04 DIAGNOSIS — F4321 Adjustment disorder with depressed mood: Secondary | ICD-10-CM | POA: Diagnosis not present

## 2019-11-11 DIAGNOSIS — F4321 Adjustment disorder with depressed mood: Secondary | ICD-10-CM | POA: Diagnosis not present

## 2019-11-18 DIAGNOSIS — F4321 Adjustment disorder with depressed mood: Secondary | ICD-10-CM | POA: Diagnosis not present

## 2019-11-26 DIAGNOSIS — F4321 Adjustment disorder with depressed mood: Secondary | ICD-10-CM | POA: Diagnosis not present

## 2019-12-03 DIAGNOSIS — F4321 Adjustment disorder with depressed mood: Secondary | ICD-10-CM | POA: Diagnosis not present

## 2019-12-10 DIAGNOSIS — F4321 Adjustment disorder with depressed mood: Secondary | ICD-10-CM | POA: Diagnosis not present

## 2019-12-17 DIAGNOSIS — F4321 Adjustment disorder with depressed mood: Secondary | ICD-10-CM | POA: Diagnosis not present

## 2020-05-05 ENCOUNTER — Telehealth: Payer: Self-pay | Admitting: Physician Assistant

## 2020-05-18 ENCOUNTER — Telehealth: Payer: Self-pay

## 2020-05-18 NOTE — Telephone Encounter (Signed)
Hospital follow up appoint made  Patient aware

## 2020-05-20 ENCOUNTER — Encounter: Payer: Self-pay | Admitting: Nurse Practitioner

## 2020-05-20 ENCOUNTER — Ambulatory Visit (INDEPENDENT_AMBULATORY_CARE_PROVIDER_SITE_OTHER): Payer: Commercial Managed Care - PPO | Admitting: Nurse Practitioner

## 2020-05-20 ENCOUNTER — Other Ambulatory Visit: Payer: Self-pay

## 2020-05-20 VITALS — BP 104/70 | HR 90 | Temp 98.1°F | Ht 64.7 in | Wt 245.0 lb

## 2020-05-20 DIAGNOSIS — Z09 Encounter for follow-up examination after completed treatment for conditions other than malignant neoplasm: Secondary | ICD-10-CM | POA: Diagnosis not present

## 2020-05-20 DIAGNOSIS — R059 Cough, unspecified: Secondary | ICD-10-CM | POA: Diagnosis not present

## 2020-05-20 MED ORDER — BENZONATATE 100 MG PO CAPS
100.0000 mg | ORAL_CAPSULE | Freq: Three times a day (TID) | ORAL | 0 refills | Status: DC | PRN
Start: 2020-05-20 — End: 2020-06-23

## 2020-05-20 NOTE — Assessment & Plan Note (Signed)
Cough symptoms not well controlled.  Started patient on Tessalon Perles 100 mg tablet 3 times daily as needed.  Education provided to patient with printed handouts given. Rx sent to pharmacy  Follow-up with unresolved symptoms.

## 2020-05-20 NOTE — Assessment & Plan Note (Signed)
Patient is gradually regaining strength after hospital discharge with COVID-pneumonia.  Patient is reporting mild cough, patient is also reporting a 15 pound weight gain in the last hospital admission stay.  Checked patient's weight in  chart and a total of 5 pounds weight gain is documented since 2020 till date.  Discharge instructions completed with medication reconciliation.  Patient in the future will need cardiology referral as EKG shows bundle branch block.  Sleep study recommendation for snoring.  Patient will return in 4 to 6 weeks for repeat chest x-ray to rule out pneumonia.

## 2020-05-20 NOTE — Progress Notes (Signed)
Established Patient Office Visit  Subjective:  Patient ID: Joan Ball, female    DOB: 1991-03-26  Age: 30 y.o. MRN: [id number]  CC:  Chief Complaint  Patient presents with  . Hospitalization Follow-up    Covid pneumonia 05/05/2020    HPI Joan Ball is a 30 year old female with no significant past medical history.  Presents for follow-up hospital discharge after contracting COVID-pneumonia.  Patient presented to the emergency department with hypoxia, shortness of breath, fever with dry cough and pleuritic chest.  Patient was treated with supplemental oxygen, Lasix, and Eliquis.  Patient was discharged home when stable to continue activity as tolerated, and a low calorie healthy diet.  Patient denies fever, body aches but reports mild cough in the last few days. Completed hospital discharge instructions with medication reconciliation completed.  Patient verbalized understanding.  Past Medical History:  Diagnosis Date  . Allergy   . Asthma    as a child  . Heart murmur     Past Surgical History:  Procedure Laterality Date  . TONSILLECTOMY      Family History  Problem Relation Age of Onset  . Fibromyalgia Mother   . Depression Mother   . Hyperlipidemia Mother   . Allergic rhinitis Mother   . Cancer Maternal Grandmother   . Cancer Maternal Grandfather   . Diabetes Maternal Grandfather   . Heart disease Maternal Grandfather   . Heart disease Paternal Grandmother   . Heart disease Paternal Grandfather   . Diabetes Paternal Grandfather   . Urticaria Neg Hx   . Eczema Neg Hx     Social History   Socioeconomic History  . Marital status: Single    Spouse name: Not on file  . Number of children: Not on file  . Years of education: Not on file  . Highest education level: Not on file  Occupational History  . Not on file  Tobacco Use  . Smoking status: Never Smoker  . Smokeless tobacco: Never Used  Vaping Use  . Vaping Use: Never used  Substance and Sexual  Activity  . Alcohol use: No  . Drug use: No  . Sexual activity: Yes  Other Topics Concern  . Not on file  Social History Narrative  . Not on file   Social Determinants of Health   Financial Resource Strain: Not on file  Food Insecurity: Not on file  Transportation Needs: Not on file  Physical Activity: Not on file  Stress: Not on file  Social Connections: Not on file  Intimate Partner Violence: Not on file    Outpatient Medications Prior to Visit  Medication Sig Dispense Refill  . albuterol (PROVENTIL HFA;VENTOLIN HFA) 108 (90 Base) MCG/ACT inhaler Inhale 2 puffs into the lungs every 6 (six) hours as needed for wheezing or shortness of breath. 1 Inhaler 0  . azelastine (ASTELIN) 0.1 % nasal spray Place 1 spray into both nostrils 2 (two) times daily. Use in each nostril as directed 30 mL 11  . EPINEPHrine (AUVI-Q) 0.3 mg/0.3 mL IJ SOAJ injection Use as directed for severe allergic reaction 2 Device 1  . meclizine (ANTIVERT) 25 MG tablet Take 1 tablet (25 mg total) by mouth 3 (three) times daily as needed for dizziness or nausea. 60 tablet 5  . metaxalone (SKELAXIN) 800 MG tablet Take 1 tablet (800 mg total) by mouth 3 (three) times daily. 60 tablet 0  . predniSONE (STERAPRED UNI-PAK 48 TAB) 10 MG (48) TBPK tablet Take as directed for 12 days 48 tablet  0   No facility-administered medications prior to visit.    Allergies  Allergen Reactions  . Advil [Ibuprofen]     Hives   . Peach [Prunus Persica]   . Amoxicillin Rash    Other Other   . Penicillin G Rash    Other Other     ROS Review of Systems  Constitutional: Negative.   HENT: Negative.   Eyes: Negative.   Respiratory: Positive for cough.   Cardiovascular: Negative.   Gastrointestinal: Negative.   Musculoskeletal: Negative.   Skin: Negative.   All other systems reviewed and are negative.     Objective:    Physical Exam Vitals reviewed.  Constitutional:      Appearance: Normal appearance.  HENT:      Head: Normocephalic.     Nose: No congestion.     Mouth/Throat:     Mouth: Mucous membranes are moist.     Pharynx: Oropharynx is clear.  Cardiovascular:     Rate and Rhythm: Normal rate and regular rhythm.     Pulses: Normal pulses.     Heart sounds: Normal heart sounds.  Pulmonary:     Breath sounds: Normal breath sounds.     Comments: Cough  Abdominal:     General: Bowel sounds are normal.  Musculoskeletal:        General: Normal range of motion.  Skin:    General: Skin is warm.  Neurological:     Mental Status: She is alert and oriented to person, place, and time.  Psychiatric:        Behavior: Behavior normal.     BP 104/70   Pulse 90   Temp 98.1 F (36.7 C)   Ht 5' 4.7" (1.643 m)   Wt 245 lb (111.1 kg)   SpO2 96%   BMI 41.15 kg/m  Wt Readings from Last 3 Encounters:  05/20/20 245 lb (111.1 kg)  07/01/18 240 lb 6.4 oz (109 kg)  01/03/18 241 lb 6.4 oz (109.5 kg)     Health Maintenance Due  Topic Date Due  . Hepatitis C Screening  Never done  . HIV Screening  Never done  . PAP-Cervical Cytology Screening  Never done  . PAP SMEAR-Modifier  Never done    There are no preventive care reminders to display for this patient.  Lab Results  Component Value Date   TSH 3.720 06/27/2017   Lab Results  Component Value Date   WBC 10.8 06/27/2017   HGB 12.1 06/27/2017   HCT 37.9 06/27/2017   MCV 75 (L) 06/27/2017   PLT 304 06/27/2017   Lab Results  Component Value Date   NA 143 06/27/2017   K 3.9 06/27/2017   CO2 21 06/27/2017   GLUCOSE 84 06/27/2017   BUN 7 06/27/2017   CREATININE 0.76 06/27/2017   BILITOT <0.2 06/27/2017   ALKPHOS 79 06/27/2017   AST 15 06/27/2017   ALT 13 06/27/2017   PROT 7.1 06/27/2017   ALBUMIN 4.1 06/27/2017   CALCIUM 9.2 06/27/2017   Lab Results  Component Value Date   CHOL 173 06/27/2017   Lab Results  Component Value Date   HDL 36 (L) 06/27/2017   Lab Results  Component Value Date   LDLCALC 103 (H) 06/27/2017    Lab Results  Component Value Date   TRIG 171 (H) 06/27/2017   Lab Results  Component Value Date   CHOLHDL 4.8 (H) 06/27/2017   No results found for: HGBA1C    Assessment & Plan:  Problem List Items Addressed This Visit      Other   Cough - Primary    Cough symptoms not well controlled.  Started patient on Tessalon Perles 100 mg tablet 3 times daily as needed.  Education provided to patient with printed handouts given. Rx sent to pharmacy  Follow-up with unresolved symptoms.      Relevant Medications   benzonatate (TESSALON PERLES) 100 MG capsule   Hospital discharge follow-up    Patient is gradually regaining strength after hospital discharge with COVID-pneumonia.  Patient is reporting mild cough, patient is also reporting a 15 pound weight gain in the last hospital admission stay.  Checked patient's weight in  chart and a total of 5 pounds weight gain is documented since 2020 till date.  Discharge instructions completed with medication reconciliation.  Patient in the future will need cardiology referral as EKG shows bundle branch block.  Sleep study recommendation for snoring.  Patient will return in 4 to 6 weeks for repeat chest x-ray to rule out pneumonia.         Meds ordered this encounter  Medications  . benzonatate (TESSALON PERLES) 100 MG capsule    Sig: Take 1 capsule (100 mg total) by mouth 3 (three) times daily as needed for cough.    Dispense:  30 capsule    Refill:  0    Order Specific Question:   Supervising Provider    Answer:   Raliegh Ip [[id number]]    Follow-up: Return if symptoms worsen or fail to improve.    Daryll Drown, NP

## 2020-05-20 NOTE — Patient Instructions (Signed)
Community-Acquired Pneumonia, Adult Pneumonia is an infection of the lungs. It causes irritation and swelling in the airways of the lungs. Mucus and fluid may also build up inside the airways. This may cause coughing and trouble breathing. One type of pneumonia can happen while you are in a hospital. A different type can happen when you are not in a hospital (community-acquired pneumonia). What are the causes? This condition is caused by germs (viruses, bacteria, or fungi). Some types of germs can spread from person to person. Pneumonia is not thought to spread from person to person.   What increases the risk? You are more likely to develop this condition if:  You have a long-term (chronic) disease, such as: ? Disease of the lungs. This may be chronic obstructive pulmonary disease (COPD) or asthma. ? Heart failure. ? Cystic fibrosis. ? Diabetes. ? Kidney disease. ? Sickle cell disease. ? HIV.  You have other health problems, such as: ? Your body's defense system (immune system) is weak. ? A condition that may cause you to breathe in fluids from your mouth and nose.  You had your spleen taken out.  You do not take good care of your teeth and mouth (poor dental hygiene).  You use or have used tobacco products.  You travel where the germs that cause this illness are common.  You are near certain animals or the places they live.  You are older than 29 years of age. What are the signs or symptoms? Symptoms of this condition include:  A cough.  A fever.  Sweating or chills.  Chest pain, often when you breathe deeply or cough.  Breathing problems, such as: ? Fast breathing. ? Trouble breathing. ? Shortness of breath.  Feeling tired (fatigued).  Muscle aches. How is this treated? Treatment for this condition depends on many things, such as:  The cause of your illness.  Your medicines.  Your other health problems. Most adults can be treated at home. Sometimes,  treatment must happen in a hospital.  Treatment may include medicines to kill germs.  Medicines may depend on which germ caused your illness. Very bad pneumonia is rare. If you get it, you may:  Have a machine to help you breathe.  Have fluid taken away from around your lungs. Follow these instructions at home: Medicines  Take over-the-counter and prescription medicines only as told by your doctor.  Take cough medicine only if you are losing sleep. Cough medicine can keep your body from taking mucus away from your lungs.  If you were prescribed an antibiotic medicine, take it as told by your doctor. Do not stop taking the antibiotic even if you start to feel better. Lifestyle  Do not drink alcohol.  Do not use any products that contain nicotine or tobacco, such as cigarettes, e-cigarettes, and chewing tobacco. If you need help quitting, ask your doctor.  Eat a healthy diet. This includes a lot of vegetables, fruits, whole grains, low-fat dairy products, and low-fat (lean) protein.      General instructions  Rest a lot. Sleep for at least 8 hours each night.  Sleep with your head and neck raised. Put a few pillows under your head or sleep in a reclining chair.  Return to your normal activities as told by your doctor. Ask your doctor what activities are safe for you.  Drink enough fluid to keep your pee (urine) pale yellow.  If your throat is sore, rinse your mouth often with salt water. To make salt   water, dissolve -1 tsp (3-6 g) of salt in 1 cup (237 mL) of warm water.  Keep all follow-up visits as told by your doctor. This is important.   How is this prevented? You can lower your risk of pneumonia by:  Getting the pneumonia shot (vaccine). These shots have different types and schedules. Ask your doctor what works best for you. Think about getting this shot if: ? You are older than 29 years of age. ? You are 19-65 years of age and:  You are being treated for  cancer.  You have long-term lung disease.  You have other problems that affect your body's defense system. Ask your doctor if you have one of these.  Getting your flu shot every year. Ask your doctor which type of shot is best for you.  Going to the dentist as often as told.  Washing your hands often with soap and water for at least 20 seconds. If you cannot use soap and water, use hand sanitizer. Contact a doctor if:  You have a fever.  You lose sleep because your cough medicine does not help. Get help right away if:  You are short of breath and this gets worse.  You have more chest pain.  Your sickness gets worse. This is very serious if: ? You are an older adult. ? Your body's defense system is weak.  You cough up blood. These symptoms may be an emergency. Do not wait to see if the symptoms will go away. Get medical help right away. Call your local emergency services (911 in the [place] Do not drive yourself to the hospital. Summary  Pneumonia is an infection of the lungs.  Community-acquired pneumonia affects people who have not been in the hospital. Certain germs can cause this infection.  This condition may be treated with medicines that kill germs.  For very bad pneumonia, you may need a hospital stay and treatment to help with breathing. This information is not intended to replace advice given to you by your health care provider. Make sure you discuss any questions you have with your health care provider. Document Revised: 01/27/2019 Document Reviewed: 01/27/2019 Elsevier Patient Education  2021 [employer]vier Inc.  

## 2020-06-23 ENCOUNTER — Other Ambulatory Visit: Payer: Self-pay

## 2020-06-23 ENCOUNTER — Encounter: Payer: Self-pay | Admitting: Nurse Practitioner

## 2020-06-23 ENCOUNTER — Ambulatory Visit (INDEPENDENT_AMBULATORY_CARE_PROVIDER_SITE_OTHER): Payer: Commercial Managed Care - PPO

## 2020-06-23 ENCOUNTER — Ambulatory Visit (INDEPENDENT_AMBULATORY_CARE_PROVIDER_SITE_OTHER): Payer: Commercial Managed Care - PPO | Admitting: Nurse Practitioner

## 2020-06-23 VITALS — BP 120/72 | HR 89 | Temp 98.0°F | Ht 64.7 in | Wt 249.2 lb

## 2020-06-23 DIAGNOSIS — M5489 Other dorsalgia: Secondary | ICD-10-CM | POA: Diagnosis not present

## 2020-06-23 DIAGNOSIS — U071 COVID-19: Secondary | ICD-10-CM

## 2020-06-23 DIAGNOSIS — M549 Dorsalgia, unspecified: Secondary | ICD-10-CM | POA: Insufficient documentation

## 2020-06-23 DIAGNOSIS — J1282 Pneumonia due to coronavirus disease 2019: Secondary | ICD-10-CM | POA: Diagnosis not present

## 2020-06-23 MED ORDER — METHOCARBAMOL 500 MG PO TABS: 500.0000 mg | ORAL_TABLET | Freq: Three times a day (TID) | ORAL | 0 refills | Status: DC

## 2020-06-23 NOTE — Patient Instructions (Signed)
Community-Acquired Pneumonia, Adult Pneumonia is an infection of the lungs. It causes irritation and swelling in the airways of the lungs. Mucus and fluid may also build up inside the airways. This may cause coughing and trouble breathing. One type of pneumonia can happen while you are in a hospital. A different type can happen when you are not in a hospital (community-acquired pneumonia). What are the causes? This condition is caused by germs (viruses, bacteria, or fungi). Some types of germs can spread from person to person. Pneumonia is not thought to spread from person to person.   What increases the risk? You are more likely to develop this condition if:  You have a long-term (chronic) disease, such as: ? Disease of the lungs. This may be chronic obstructive pulmonary disease (COPD) or asthma. ? Heart failure. ? Cystic fibrosis. ? Diabetes. ? Kidney disease. ? Sickle cell disease. ? HIV.  You have other health problems, such as: ? Your body's defense system (immune system) is weak. ? A condition that may cause you to breathe in fluids from your mouth and nose.  You had your spleen taken out.  You do not take good care of your teeth and mouth (poor dental hygiene).  You use or have used tobacco products.  You travel where the germs that cause this illness are common.  You are near certain animals or the places they live.  You are older than 29 years of age. What are the signs or symptoms? Symptoms of this condition include:  A cough.  A fever.  Sweating or chills.  Chest pain, often when you breathe deeply or cough.  Breathing problems, such as: ? Fast breathing. ? Trouble breathing. ? Shortness of breath.  Feeling tired (fatigued).  Muscle aches. How is this treated? Treatment for this condition depends on many things, such as:  The cause of your illness.  Your medicines.  Your other health problems. Most adults can be treated at home. Sometimes,  treatment must happen in a hospital.  Treatment may include medicines to kill germs.  Medicines may depend on which germ caused your illness. Very bad pneumonia is rare. If you get it, you may:  Have a machine to help you breathe.  Have fluid taken away from around your lungs. Follow these instructions at home: Medicines  Take over-the-counter and prescription medicines only as told by your doctor.  Take cough medicine only if you are losing sleep. Cough medicine can keep your body from taking mucus away from your lungs.  If you were prescribed an antibiotic medicine, take it as told by your doctor. Do not stop taking the antibiotic even if you start to feel better. Lifestyle  Do not drink alcohol.  Do not use any products that contain nicotine or tobacco, such as cigarettes, e-cigarettes, and chewing tobacco. If you need help quitting, ask your doctor.  Eat a healthy diet. This includes a lot of vegetables, fruits, whole grains, low-fat dairy products, and low-fat (lean) protein.      General instructions  Rest a lot. Sleep for at least 8 hours each night.  Sleep with your head and neck raised. Put a few pillows under your head or sleep in a reclining chair.  Return to your normal activities as told by your doctor. Ask your doctor what activities are safe for you.  Drink enough fluid to keep your pee (urine) pale yellow.  If your throat is sore, rinse your mouth often with salt water. To make salt   water, dissolve -1 tsp (3-6 g) of salt in 1 cup (237 mL) of warm water.  Keep all follow-up visits as told by your doctor. This is important.   How is this prevented? You can lower your risk of pneumonia by:  Getting the pneumonia shot (vaccine). These shots have different types and schedules. Ask your doctor what works best for you. Think about getting this shot if: ? You are older than 29 years of age. ? You are 19-65 years of age and:  You are being treated for  cancer.  You have long-term lung disease.  You have other problems that affect your body's defense system. Ask your doctor if you have one of these.  Getting your flu shot every year. Ask your doctor which type of shot is best for you.  Going to the dentist as often as told.  Washing your hands often with soap and water for at least 20 seconds. If you cannot use soap and water, use hand sanitizer. Contact a doctor if:  You have a fever.  You lose sleep because your cough medicine does not help. Get help right away if:  You are short of breath and this gets worse.  You have more chest pain.  Your sickness gets worse. This is very serious if: ? You are an older adult. ? Your body's defense system is weak.  You cough up blood. These symptoms may be an emergency. Do not wait to see if the symptoms will go away. Get medical help right away. Call your local emergency services (911 in the [place] Do not drive yourself to the hospital. Summary  Pneumonia is an infection of the lungs.  Community-acquired pneumonia affects people who have not been in the hospital. Certain germs can cause this infection.  This condition may be treated with medicines that kill germs.  For very bad pneumonia, you may need a hospital stay and treatment to help with breathing. This information is not intended to replace advice given to you by your health care provider. Make sure you discuss any questions you have with your health care provider. Document Revised: 01/27/2019 Document Reviewed: 01/27/2019 Elsevier Patient Education  2021 [employer]vier Inc.  

## 2020-06-23 NOTE — Progress Notes (Signed)
Established Patient Office Visit  Subjective:  Patient ID: Joan Ball, female    DOB: 11-17-1990  Age: 30 y.o. MRN: [id number]  CC:  Chief Complaint  Patient presents with  . Follow-up    Covid pneumonia    HPI Joan Ball is a 30 year old female with a past medical history of asthma, allergy and heart murmur.  Patient is present today for follow-up COVID pneumonia.  Patient is reporting improved breathing, shortness of breath experienced only when walking on an incline. No cough, fever, or congestion.  Past Medical History:  Diagnosis Date  . Allergy   . Asthma    as a child  . Heart murmur     Past Surgical History:  Procedure Laterality Date  . TONSILLECTOMY      Family History  Problem Relation Age of Onset  . Fibromyalgia Mother   . Depression Mother   . Hyperlipidemia Mother   . Allergic rhinitis Mother   . Cancer Maternal Grandmother   . Cancer Maternal Grandfather   . Diabetes Maternal Grandfather   . Heart disease Maternal Grandfather   . Heart disease Paternal Grandmother   . Heart disease Paternal Grandfather   . Diabetes Paternal Grandfather   . Urticaria Neg Hx   . Eczema Neg Hx     Social History   Socioeconomic History  . Marital status: Single    Spouse name: Not on file  . Number of children: Not on file  . Years of education: Not on file  . Highest education level: Not on file  Occupational History  . Not on file  Tobacco Use  . Smoking status: Never Smoker  . Smokeless tobacco: Never Used  Vaping Use  . Vaping Use: Never used  Substance and Sexual Activity  . Alcohol use: No  . Drug use: No  . Sexual activity: Yes  Other Topics Concern  . Not on file  Social History Narrative  . Not on file   Social Determinants of Health   Financial Resource Strain: Not on file  Food Insecurity: Not on file  Transportation Needs: Not on file  Physical Activity: Not on file  Stress: Not on file  Social Connections: Not on file   Intimate Partner Violence: Not on file    Outpatient Medications Prior to Visit  Medication Sig Dispense Refill  . albuterol (PROVENTIL HFA;VENTOLIN HFA) 108 (90 Base) MCG/ACT inhaler Inhale 2 puffs into the lungs every 6 (six) hours as needed for wheezing or shortness of breath. 1 Inhaler 0  . azelastine (ASTELIN) 0.1 % nasal spray Place 1 spray into both nostrils 2 (two) times daily. Use in each nostril as directed 30 mL 11  . EPINEPHrine (AUVI-Q) 0.3 mg/0.3 mL IJ SOAJ injection Use as directed for severe allergic reaction 2 Device 1  . meclizine (ANTIVERT) 25 MG tablet Take 1 tablet (25 mg total) by mouth 3 (three) times daily as needed for dizziness or nausea. 60 tablet 5  . benzonatate (TESSALON PERLES) 100 MG capsule Take 1 capsule (100 mg total) by mouth 3 (three) times daily as needed for cough. 30 capsule 0   No facility-administered medications prior to visit.    Allergies  Allergen Reactions  . Advil [Ibuprofen]     Hives   . Peach [Prunus Persica]   . Amoxicillin Rash    Other Other   . Penicillin G Rash    Other Other     ROS Review of Systems  Constitutional: Negative.   HENT:  Negative.  Negative for congestion.   Respiratory: Negative for cough and chest tightness.   Cardiovascular: Negative.   Gastrointestinal: Negative.   Genitourinary: Negative.   Musculoskeletal: Negative.   Skin: Negative.   All other systems reviewed and are negative.     Objective:    Physical Exam Vitals reviewed.  Constitutional:      Appearance: Normal appearance.  HENT:     Head: Normocephalic.     Nose: Nose normal. No congestion.     Mouth/Throat:     Pharynx: Oropharynx is clear.  Eyes:     Conjunctiva/sclera: Conjunctivae normal.  Cardiovascular:     Rate and Rhythm: Normal rate and regular rhythm.     Pulses: Normal pulses.     Heart sounds: Normal heart sounds.  Pulmonary:     Effort: Pulmonary effort is normal.     Breath sounds: Normal breath sounds.   Abdominal:     General: Bowel sounds are normal.  Skin:    General: Skin is warm.  Neurological:     Mental Status: She is alert and oriented to person, place, and time.  Psychiatric:        Behavior: Behavior normal.     BP 120/72   Pulse 89   Temp 98 F (36.7 C)   Ht 5' 4.7" (1.643 m)   Wt 249 lb 3.2 oz (113 kg)   SpO2 96%   BMI 41.85 kg/m  Wt Readings from Last 3 Encounters:  06/23/20 249 lb 3.2 oz (113 kg)  05/20/20 245 lb (111.1 kg)  07/01/18 240 lb 6.4 oz (109 kg)     Health Maintenance Due  Topic Date Due  . Hepatitis C Screening  Never done  . HIV Screening  Never done  . PAP-Cervical Cytology Screening  Never done  . PAP SMEAR-Modifier  Never done    There are no preventive care reminders to display for this patient.  Lab Results  Component Value Date   TSH 3.720 06/27/2017   Lab Results  Component Value Date   WBC 10.8 06/27/2017   HGB 12.1 06/27/2017   HCT 37.9 06/27/2017   MCV 75 (L) 06/27/2017   PLT 304 06/27/2017   Lab Results  Component Value Date   NA 143 06/27/2017   K 3.9 06/27/2017   CO2 21 06/27/2017   GLUCOSE 84 06/27/2017   BUN 7 06/27/2017   CREATININE 0.76 06/27/2017   BILITOT <0.2 06/27/2017   ALKPHOS 79 06/27/2017   AST 15 06/27/2017   ALT 13 06/27/2017   PROT 7.1 06/27/2017   ALBUMIN 4.1 06/27/2017   CALCIUM 9.2 06/27/2017   Lab Results  Component Value Date   CHOL 173 06/27/2017   Lab Results  Component Value Date   HDL 36 (L) 06/27/2017   Lab Results  Component Value Date   LDLCALC 103 (H) 06/27/2017   Lab Results  Component Value Date   TRIG 171 (H) 06/27/2017   Lab Results  Component Value Date   CHOLHDL 4.8 (H) 06/27/2017   No results found for: HGBA1C    Assessment & Plan:   Problem List Items Addressed This Visit      Respiratory   Pneumonia due to COVID-19 virus - Primary    Symptoms well controlled. Repeat chest x-ray to rule out covid-19. results pending.         Relevant Orders    DG Chest 2 View     Other   Back pain without sciatica  Relevant Medications   methocarbamol (ROBAXIN) 500 MG tablet      Meds ordered this encounter  Medications  . methocarbamol (ROBAXIN) 500 MG tablet    Sig: Take 1 tablet (500 mg total) by mouth 3 (three) times daily.    Dispense:  30 tablet    Refill:  0    Order Specific Question:   Supervising Provider    Answer:   Raliegh Ip [[id number]]    Follow-up: Return if symptoms worsen or fail to improve.    Daryll Drown, NP

## 2020-06-23 NOTE — Assessment & Plan Note (Signed)
Symptoms well controlled. Repeat chest x-ray to rule out covid-19. results pending.

## 2020-06-24 ENCOUNTER — Telehealth: Payer: Self-pay

## 2020-06-26 NOTE — Telephone Encounter (Signed)
I do not want to continue seeing patient please set her up with Dr Darlyn Read as desired.

## 2020-06-28 NOTE — Telephone Encounter (Signed)
Left message to call back  

## 2020-06-28 NOTE — Telephone Encounter (Signed)
I will take her. WS

## 2020-07-05 NOTE — Telephone Encounter (Signed)
Patient has appt with [facility] on 3-1

## 2020-07-21 ENCOUNTER — Ambulatory Visit: Payer: Commercial Managed Care - PPO

## 2020-07-21 ENCOUNTER — Encounter: Payer: Self-pay | Admitting: Pulmonary Disease

## 2020-07-21 ENCOUNTER — Other Ambulatory Visit: Payer: Self-pay

## 2020-07-21 ENCOUNTER — Ambulatory Visit (INDEPENDENT_AMBULATORY_CARE_PROVIDER_SITE_OTHER): Payer: Commercial Managed Care - PPO | Admitting: Pulmonary Disease

## 2020-07-21 VITALS — BP 116/82 | HR 76 | Temp 97.9°F | Ht 65.0 in | Wt 254.4 lb

## 2020-07-21 DIAGNOSIS — U071 COVID-19: Secondary | ICD-10-CM | POA: Diagnosis not present

## 2020-07-21 DIAGNOSIS — R0789 Other chest pain: Secondary | ICD-10-CM

## 2020-07-21 DIAGNOSIS — J1282 Pneumonia due to coronavirus disease 2019: Secondary | ICD-10-CM

## 2020-07-21 NOTE — Progress Notes (Signed)
@Patient  ID: , female    DOB: 1990-06-26, 30 y.o.   MRN: [id number]  Chief Complaint  Patient presents with  . Consult    Self referral for post covid PNA. Was diagnosed with COVID back in January 2022. States she spent 10 days in the hospital at [facility].     Referring provider: [facility], MD  HPI:   30 year old whom we are seeing for evaluation of recent Covid pneumonia.  Multiple notes from hospitalization 04/2020 reviewed.  PCP note reviewed.  Patient contracted COVID 04/2020.  Spent 6 days in the hospital.  This has 6 L nasal cannula.  Received remdesivir, dexamethasone.  Slowly improved.  Discharged on 2 to 3 L nasal cannula.  Finished outpatient dexamethasone course.  Over the next 2 or 3 days, was able to come off oxygen.  Was monitoring oxygen at home.  Notices her oxygen saturations continue to improve, routinely now in the high 90s from low 90s in the preceding weeks.  Chief complaint today is left-sided chest tightness, back pain.  Worse with inspiration.  Feels like cannot take deep breath.  No positional changes with things are better or worse.  No environmental changes.  No timing during the day where things are better or worse.  Has used albuterol without improvement.  No other alleviating or exacerbating factors.  Reviewed imaging, report of CT chest at time of hospitalization 1/20 282 reviewed (no images) describe bilateral groundglass opacities consistent with COVID-19 pneumonia, otherwise clear.  Chest x-ray 05/2020 personally reviewed and interpreted as streaky opacities bilaterally, atelectasis versus fibrosis versus healing infiltrates from recent Covid 19 pneumonia.  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  MMRC: No flowsheet data found.  Epworth:  No flowsheet data found.  Tests:   FENO:  No results found for: NITRICOXIDE  PFT: No flowsheet data found.  WALK:  No flowsheet data found.  Imaging: Personally reviewed and as per EMR discussion in this note DG Chest 2 View  Result Date: 06/24/2020 CLINICAL DATA:  Pneumonia due to COVID-19 virus. Follow up to rule out COVID-19 pneumonia. EXAM: CHEST - 2 VIEW COMPARISON:  Prior chest radiographs 02/08/2008 and earlier. FINDINGS: Heart size within normal limits. Mild prominence of the interstitial lung markings most notably within the left greater than right mid lung fields and left lung base. No evidence of pleural effusion or pneumothorax. No acute bony abnormality identified. IMPRESSION: Mild prominence of the interstitial lung markings most notably within the left greater than right mid lung fields and left lung base. Findings are suspicious for sequela of atypical/viral pneumonia given the provided history. Electronically Signed   By: 04/09/2008 DO   On: 06/24/2020 18:56    Lab Results: Personally reviewed, eosinophils as high as 300 recently CBC    Component Value Date/Time   WBC 10.8 06/27/2017 1001   RBC 5.05 06/27/2017 1001   HGB 12.1 06/27/2017 1001   HCT 37.9 06/27/2017 1001   PLT 304 06/27/2017 1001   MCV 75 (L) 06/27/2017 1001   MCH 24.0 (L) 06/27/2017 1001   MCHC 31.9 06/27/2017 1001   RDW 16.2 (H) 06/27/2017 1001   LYMPHSABS 2.6  06/27/2017 1001   EOSABS 0.3 06/27/2017 1001   BASOSABS 0.0 06/27/2017 1001    BMET    Component Value Date/Time   NA 143 06/27/2017 1001   K 3.9 06/27/2017 1001   CL 107 (H) 06/27/2017 1001   CO2 21 06/27/2017 1001   GLUCOSE 84 06/27/2017 1001   GLUCOSE 94 02/08/2008 2018   BUN 7 06/27/2017 1001   CREATININE 0.76  06/27/2017 1001   CALCIUM 9.2 06/27/2017 1001   GFRNONAA 109 06/27/2017 1001   GFRAA 126 06/27/2017 1001    BNP No results found for: BNP  ProBNP No results found for: PROBNP  Specialty Problems      Pulmonary Problems   Seasonal and perennial allergic rhinitis   Cough   Pneumonia due to COVID-19 virus      Allergies  Allergen Reactions  . Alpha-Gal Hives  . Advil [Ibuprofen]     Hives   . Peach [Prunus Persica]   . Amoxicillin Rash    Other Other   . Penicillin G Rash    Other Other     Immunization History  Administered Date(s) Administered  . DTaP 10/08/1991, 01/07/1992, 03/03/1992, 10/06/1992, 08/25/1996  . Hepatitis B 10/08/1991, 01/07/1992, 06/09/1992  . HiB (PRP-OMP) 10/08/1991, 01/07/1992, 03/03/1992, 10/06/1992  . IPV 10/08/1991, 01/07/1992, 03/03/1992, 10/06/1992  . Tdap 12/21/2015  . Varicella 12/23/2015, 01/20/2016    Past Medical History:  Diagnosis Date  . Allergy   . Asthma    as a child  . Heart murmur     Tobacco History: Social History   Tobacco Use  Smoking Status Never Smoker  Smokeless Tobacco Never Used   Counseling given: Not Answered   Continue to not smoke  Outpatient Encounter Medications as of 07/21/2020  Medication Sig  . albuterol (PROVENTIL HFA;VENTOLIN HFA) 108 (90 Base) MCG/ACT inhaler Inhale 2 puffs into the lungs every 6 (six) hours as needed for wheezing or shortness of breath.  Marland Kitchen azelastine (ASTELIN) 0.1 % nasal spray Place 1 spray into both nostrils 2 (two) times daily. Use in each nostril as directed  . EPINEPHrine (AUVI-Q) 0.3 mg/0.3 mL IJ SOAJ injection Use as directed for severe allergic reaction  . meclizine (ANTIVERT) 25 MG tablet Take 1 tablet (25 mg total) by mouth 3 (three) times daily as needed for dizziness or nausea.  . [DISCONTINUED] methocarbamol (ROBAXIN) 500 MG tablet Take 1 tablet (500 mg total) by mouth 3 (three) times daily.   No facility-administered encounter medications on file as of  07/21/2020.     Review of Systems  Review of Systems  No chest pain with exertion.  No orthopnea or PND.  Comprehensive review systems otherwise negative. Physical Exam  BP 116/82   Pulse 76   Temp 97.9 F (36.6 C) (Temporal)   Ht 5\' 5"  (1.651 m)   Wt 254 lb 6.4 oz (115.4 kg)   SpO2 98% Comment: on RA  BMI 42.33 kg/m   Wt Readings from Last 5 Encounters:  07/21/20 254 lb 6.4 oz (115.4 kg)  06/23/20 249 lb 3.2 oz (113 kg)  05/20/20 245 lb (111.1 kg)  07/01/18 240 lb 6.4 oz (109 kg)  01/03/18 241 lb 6.4 oz (109.5 kg)    BMI Readings from Last 5 Encounters:  07/21/20 42.33 kg/m  06/23/20 41.85 kg/m  05/20/20 41.15 kg/m  07/01/18 40.38 kg/m  01/03/18 40.54 kg/m     Physical Exam General: Well-appearing, no acute distress Eyes: EOMI, no icterus Neck: Supple, no JVP Pulmonary: Clear to auscultation  bilaterally, no wheezes, no crackles Cardiovascular: Regular rate and rhythm, no murmur Abdomen: Nondistended, bowel sounds present MSK: No synovitis, joint effusion Neuro: Normal gait, no weakness Psych: Normal mood, full affect   Assessment & Plan:   COVID-19 pneumonia: 04/2020 at [facility].  On 6 L of oxygen, discharged on 2 to 3 L.  Quickly weaned off over the course of a couple days.  No longer using oxygen.  Slowly getting stamina back.  Recent chest x-ray 05/2020 with streaky opacities suspicious for resolving infiltrates.  Will obtain chest x-ray in July/2022 for a 3-month follow-up/new baseline.  Chest tightness: Suspicious for unearthed mild asthma symptoms.  Had asthma as a child.  Albuterol not very helpful.  Discussed use of maintenance inhaler.  She declines at this time.  Consider use in the future if symptoms worsen, dyspnea on exertion does not improve.  DOE: Improving.  Likely multifactorial.  Large contribution of deconditioning, some weight gain.  Concern for possible asthma as above.  Continue to monitor.  Acute hypoxemic respiratory failure:  In setting of COVID-19 pneumonia.  Resolved at this time.  Continue to monitor.   Return in about 4 months (around 11/20/2020).   Karren Burly, MD 07/21/2020

## 2020-07-21 NOTE — Patient Instructions (Signed)
Nice to meet you  I am encouraged that you will continue to improve as you have over the last several weeks.  Part of it is getting your stamina back.  I think some of the back pain, chest tightness will improve over time as you continue to recover, get your strength back.  I will see you back in July and get a chest x-ray at that time to get a new baseline.  Return to clinic in 4 months with Dr. Judeth Horn.  Chest x-ray to be performed at that time

## 2020-07-26 ENCOUNTER — Ambulatory Visit: Payer: Commercial Managed Care - PPO | Admitting: Family Medicine

## 2020-08-04 ENCOUNTER — Encounter: Payer: Self-pay | Admitting: Family Medicine

## 2020-08-04 ENCOUNTER — Ambulatory Visit (INDEPENDENT_AMBULATORY_CARE_PROVIDER_SITE_OTHER): Payer: Commercial Managed Care - PPO | Admitting: Family Medicine

## 2020-08-04 ENCOUNTER — Other Ambulatory Visit: Payer: Self-pay

## 2020-08-04 VITALS — BP 108/67 | HR 84 | Temp 98.3°F | Ht 65.0 in | Wt 254.6 lb

## 2020-08-04 DIAGNOSIS — U071 COVID-19: Secondary | ICD-10-CM | POA: Diagnosis not present

## 2020-08-04 DIAGNOSIS — J1282 Pneumonia due to coronavirus disease 2019: Secondary | ICD-10-CM | POA: Diagnosis not present

## 2020-08-04 DIAGNOSIS — E782 Mixed hyperlipidemia: Secondary | ICD-10-CM

## 2020-08-04 NOTE — Progress Notes (Signed)
Subjective:  Patient ID: Joan Ball, female    DOB: 10-Dec-1990  Age: 30 y.o. MRN: [id number]  CC: No chief complaint on file.   HPI Joan Ball presents for just getting back to normal post CoVID pneumonia. Ready to go back to normal activities. Needs clearance for work.   Depression screen [facility] 2/9 08/04/2020 06/23/2020 05/20/2020  Decreased Interest 0 0 0  Down, Depressed, Hopeless 0 0 0  PHQ - 2 Score 0 0 0    History Joan Ball has a past medical history of Allergy, Asthma, and Heart murmur.   She has a past surgical history that includes Tonsillectomy.   Her family history includes Allergic rhinitis in her mother; Cancer in her maternal grandfather and maternal grandmother; Depression in her mother; Diabetes in her maternal grandfather and paternal grandfather; Fibromyalgia in her mother; Heart disease in her maternal grandfather, paternal grandfather, and paternal grandmother; Hyperlipidemia in her mother.She reports that she has never smoked. She has never used smokeless tobacco. She reports that she does not drink alcohol and does not use drugs.    ROS Review of Systems  Constitutional: Negative.   HENT: Negative.   Eyes: Negative for visual disturbance.  Respiratory: Negative for shortness of breath.   Cardiovascular: Negative for chest pain.  Gastrointestinal: Negative for abdominal pain.  Musculoskeletal: Negative for arthralgias.    Objective:  BP 108/67   Pulse 84   Temp 98.3 F (36.8 C)   Ht _0  (1.651 m)   Wt 254 lb 9.6 oz (115.5 kg)   SpO2 97%   BMI 42.37 kg/m   BP Readings from Last 3 Encounters:  08/04/20 108/67  07/21/20 116/82  06/23/20 120/72    Wt Readings from Last 3 Encounters:  08/04/20 254 lb 9.6 oz (115.5 kg)  07/21/20 254 lb 6.4 oz (115.4 kg)  06/23/20 249 lb 3.2 oz (113 kg)     Physical Exam Constitutional:      General: She is not in acute distress.    Appearance: She is well-developed.  HENT:     Head: Normocephalic and  atraumatic.     Right Ear: External ear normal.     Left Ear: External ear normal.     Nose: Nose normal.  Eyes:     Conjunctiva/sclera: Conjunctivae normal.     Pupils: Pupils are equal, round, and reactive to light.  Neck:     Thyroid: No thyromegaly.  Cardiovascular:     Rate and Rhythm: Normal rate and regular rhythm.     Heart sounds: Normal heart sounds. No murmur heard.   Pulmonary:     Effort: Pulmonary effort is normal. No respiratory distress.     Breath sounds: Normal breath sounds. No wheezing or rales.  Abdominal:     General: Bowel sounds are normal. There is no distension.     Palpations: Abdomen is soft.     Tenderness: There is no abdominal tenderness.  Musculoskeletal:     Cervical back: Normal range of motion and neck supple.  Lymphadenopathy:     Cervical: No cervical adenopathy.  Skin:    General: Skin is warm and dry.  Neurological:     Mental Status: She is alert and oriented to person, place, and time.     Deep Tendon Reflexes: Reflexes are normal and symmetric.  Psychiatric:        Behavior: Behavior normal.        Thought Content: Thought content normal.  Judgment: Judgment normal.       Assessment & Plan:   Diagnoses and all orders for this visit:  Pneumonia due to COVID-19 virus -     CBC with Differential/Platelet -     CMP14+EGFR  Mixed hyperlipidemia -     Lipid panel     I am having Joan Ball maintain her EPINEPHrine, azelastine, albuterol, and meclizine.  Allergies as of 08/04/2020      Reactions   Alpha-gal Hives   Advil [ibuprofen]    Hives   Peach [prunus Persica]    Amoxicillin Rash   Other Other   Penicillin G Rash   Other Other      Medication List       Accurate as of August 04, 2020 11:59 PM. If you have any questions, ask your nurse or doctor.        albuterol 108 (90 Base) MCG/ACT inhaler Commonly known as: VENTOLIN HFA Inhale 2 puffs into the lungs every 6 (six) hours as needed for  wheezing or shortness of breath.   azelastine 0.1 % nasal spray Commonly known as: ASTELIN Place 1 spray into both nostrils 2 (two) times daily. Use in each nostril as directed   EPINEPHrine 0.3 mg/0.3 mL Soaj injection Commonly known as: Auvi-Q Use as directed for severe allergic reaction   meclizine 25 MG tablet Commonly known as: ANTIVERT Take 1 tablet (25 mg total) by mouth 3 (three) times daily as needed for dizziness or nausea.        Follow-up: No follow-ups on file.  Claretta Fraise, M.D.

## 2020-08-05 ENCOUNTER — Encounter: Payer: Self-pay | Admitting: Family Medicine

## 2020-08-08 ENCOUNTER — Other Ambulatory Visit: Payer: Commercial Managed Care - PPO

## 2020-08-08 ENCOUNTER — Other Ambulatory Visit: Payer: Self-pay

## 2020-08-08 DIAGNOSIS — U071 COVID-19: Secondary | ICD-10-CM

## 2020-08-08 DIAGNOSIS — E782 Mixed hyperlipidemia: Secondary | ICD-10-CM

## 2020-08-08 DIAGNOSIS — J1282 Pneumonia due to coronavirus disease 2019: Secondary | ICD-10-CM

## 2020-08-08 NOTE — Addendum Note (Signed)
Addended by: Diamantina Monks on: 08/08/2020 03:37 PM   Modules accepted: Orders

## 2020-08-09 LAB — CMP14+EGFR
ALT: 17 IU/L (ref 0–32)
AST: 23 IU/L (ref 0–40)
Albumin/Globulin Ratio: 1.7 (ref 1.2–2.2)
Albumin: 4.5 g/dL (ref 3.9–5.0)
Alkaline Phosphatase: 88 IU/L (ref 44–121)
BUN/Creatinine Ratio: 12 (ref 9–23)
BUN: 8 mg/dL (ref 6–20)
Bilirubin Total: <0.2 mg/dL (ref 0.0–1.2)
CO2: 22 mmol/L (ref 20–29)
Calcium: 9.7 mg/dL (ref 8.7–10.2)
Chloride: 103 mmol/L (ref 96–106)
Creatinine, Ser: 0.65 mg/dL (ref 0.57–1.00)
Globulin, Total: 2.6 g/dL (ref 1.5–4.5)
Glucose: 85 mg/dL (ref 65–99)
Potassium: 3.9 mmol/L (ref 3.5–5.2)
Sodium: 140 mmol/L (ref 134–144)
Total Protein: 7.1 g/dL (ref 6.0–8.5)
eGFR: 122 mL/min/{1.73_m2} (ref >59)

## 2020-08-09 LAB — CBC WITH DIFFERENTIAL/PLATELET
Basophils Absolute: 0 10*3/uL (ref 0.0–0.2)
Basos: 0 %
EOS (ABSOLUTE): 0.3 10*3/uL (ref 0.0–0.4)
Eos: 3 %
Hematocrit: 37.1 % (ref 34.0–46.6)
Hemoglobin: 11.9 g/dL (ref 11.1–15.9)
Immature Grans (Abs): 0 10*3/uL (ref 0.0–0.1)
Immature Granulocytes: 0 %
Lymphocytes Absolute: 2.5 10*3/uL (ref 0.7–3.1)
Lymphs: 24 %
MCH: 24 pg — ABNORMAL LOW (ref 26.6–33.0)
MCHC: 32.1 g/dL (ref 31.5–35.7)
MCV: 75 fL — ABNORMAL LOW (ref 79–97)
Monocytes Absolute: 0.7 10*3/uL (ref 0.1–0.9)
Monocytes: 7 %
Neutrophils Absolute: 7.1 10*3/uL — ABNORMAL HIGH (ref 1.4–7.0)
Neutrophils: 66 %
Platelets: 401 10*3/uL (ref 150–450)
RBC: 4.96 x10E6/uL (ref 3.77–5.28)
RDW: 15.6 % — ABNORMAL HIGH (ref 11.7–15.4)
WBC: 10.7 10*3/uL (ref 3.4–10.8)

## 2020-08-09 LAB — LIPID PANEL
Chol/HDL Ratio: 6.3 ratio — ABNORMAL HIGH (ref 0.0–4.4)
Cholesterol, Total: 175 mg/dL (ref 100–199)
HDL: 28 mg/dL — ABNORMAL LOW (ref >39)
LDL Chol Calc (NIH): 92 mg/dL (ref 0–99)
Triglycerides: 326 mg/dL — ABNORMAL HIGH (ref 0–149)
VLDL Cholesterol Cal: 55 mg/dL — ABNORMAL HIGH (ref 5–40)

## 2020-08-10 ENCOUNTER — Ambulatory Visit: Payer: Commercial Managed Care - PPO | Admitting: Family Medicine

## 2020-09-13 ENCOUNTER — Other Ambulatory Visit: Payer: Self-pay

## 2020-09-13 ENCOUNTER — Emergency Department (HOSPITAL_COMMUNITY)
Admission: EM | Admit: 2020-09-13 | Discharge: 2020-09-13 | Discharge status: Against Medical Advice | Payer: Commercial Managed Care - PPO | Attending: Emergency Medicine | Admitting: Emergency Medicine

## 2020-09-13 ENCOUNTER — Emergency Department (HOSPITAL_COMMUNITY): Payer: Commercial Managed Care - PPO

## 2020-09-13 ENCOUNTER — Encounter (HOSPITAL_COMMUNITY): Payer: Self-pay | Admitting: Emergency Medicine

## 2020-09-13 DIAGNOSIS — R079 Chest pain, unspecified: Secondary | ICD-10-CM | POA: Diagnosis not present

## 2020-09-13 DIAGNOSIS — R42 Dizziness and giddiness: Secondary | ICD-10-CM | POA: Diagnosis present

## 2020-09-13 DIAGNOSIS — R55 Syncope and collapse: Secondary | ICD-10-CM | POA: Insufficient documentation

## 2020-09-13 DIAGNOSIS — Z5321 Procedure and treatment not carried out due to patient leaving prior to being seen by health care provider: Secondary | ICD-10-CM | POA: Insufficient documentation

## 2020-09-13 HISTORY — DX: COVID-19: U07.1

## 2020-09-13 HISTORY — DX: Pneumonia due to coronavirus disease 2019: J12.82

## 2020-09-13 LAB — BASIC METABOLIC PANEL
Anion gap: 8 (ref 5–15)
BUN: 6 mg/dL (ref 6–20)
CO2: 24 mmol/L (ref 22–32)
Calcium: 9.5 mg/dL (ref 8.9–10.3)
Chloride: 108 mmol/L (ref 98–111)
Creatinine, Ser: 0.76 mg/dL (ref 0.44–1.00)
GFR, Estimated: >60 mL/min (ref >60)
Glucose, Bld: 93 mg/dL (ref 70–99)
Potassium: 3.7 mmol/L (ref 3.5–5.1)
Sodium: 140 mmol/L (ref 135–145)

## 2020-09-13 LAB — HEPATIC FUNCTION PANEL
ALT: 20 U/L (ref 0–44)
AST: 24 U/L (ref 15–41)
Albumin: 4 g/dL (ref 3.5–5.0)
Alkaline Phosphatase: 67 U/L (ref 38–126)
Bilirubin, Direct: <0.1 mg/dL (ref 0.0–0.2)
Total Bilirubin: 0.5 mg/dL (ref 0.3–1.2)
Total Protein: 7.6 g/dL (ref 6.5–8.1)

## 2020-09-13 LAB — CBC
HCT: 40.2 % (ref 36.0–46.0)
Hemoglobin: 12.2 g/dL (ref 12.0–15.0)
MCH: 23.7 pg — ABNORMAL LOW (ref 26.0–34.0)
MCHC: 30.3 g/dL (ref 30.0–36.0)
MCV: 78.2 fL — ABNORMAL LOW (ref 80.0–100.0)
Platelets: 341 10*3/uL (ref 150–400)
RBC: 5.14 MIL/uL — ABNORMAL HIGH (ref 3.87–5.11)
RDW: 15.2 % (ref 11.5–15.5)
WBC: 12.9 10*3/uL — ABNORMAL HIGH (ref 4.0–10.5)
nRBC: 0 % (ref 0.0–0.2)

## 2020-09-13 LAB — URINALYSIS, ROUTINE W REFLEX MICROSCOPIC
Bilirubin Urine: NEGATIVE
Glucose, UA: NEGATIVE mg/dL
Hgb urine dipstick: NEGATIVE
Ketones, ur: 5 mg/dL — AB
Nitrite: NEGATIVE
Protein, ur: NEGATIVE mg/dL
Specific Gravity, Urine: 1.026 (ref 1.005–1.030)
pH: 5 (ref 5.0–8.0)

## 2020-09-13 LAB — TROPONIN I (HIGH SENSITIVITY)
Troponin I (High Sensitivity): <2 ng/L (ref <18)
Troponin I (High Sensitivity): 3 ng/L (ref <18)

## 2020-09-13 LAB — I-STAT BETA HCG BLOOD, ED (MC, WL, AP ONLY): I-stat hCG, quantitative: <5 m[IU]/mL (ref <5)

## 2020-09-13 LAB — LIPASE, BLOOD: Lipase: 35 U/L (ref 11–51)

## 2020-09-13 NOTE — ED Notes (Signed)
Pt wants to leave. Refusing to stay and see provider. Encouraged to stay. Pt seen leaving lobby.

## 2020-09-13 NOTE — ED Triage Notes (Signed)
Pt reports about one hour ago having intermittent chest pains after drinking a Dr. Reino Kent with nausea and lightheadedness then passed out 30 seconds after taking a sip of soda. Pt reports episode lasted about 20 seconds. Pt reports intermittent waves of feeling nauseous and lightheaded still. Pt denies any injuries.
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
## 2020-09-15 ENCOUNTER — Other Ambulatory Visit: Payer: Self-pay

## 2020-09-15 ENCOUNTER — Ambulatory Visit (INDEPENDENT_AMBULATORY_CARE_PROVIDER_SITE_OTHER): Payer: Commercial Managed Care - PPO | Admitting: Family Medicine

## 2020-09-15 ENCOUNTER — Encounter: Payer: Self-pay | Admitting: Family Medicine

## 2020-09-15 VITALS — BP 110/56 | HR 55 | Temp 98.1°F | Ht 64.0 in | Wt 251.4 lb

## 2020-09-15 DIAGNOSIS — R55 Syncope and collapse: Secondary | ICD-10-CM

## 2020-09-15 DIAGNOSIS — I498 Other specified cardiac arrhythmias: Secondary | ICD-10-CM | POA: Diagnosis not present

## 2020-09-15 DIAGNOSIS — R001 Bradycardia, unspecified: Secondary | ICD-10-CM
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
## 2020-09-16 ENCOUNTER — Telehealth: Payer: Self-pay | Admitting: Family Medicine

## 2020-09-16 NOTE — Telephone Encounter (Signed)
FYI for you and pt is scheduled with Dr Antoine Poche 09/19/20 at 8:20.

## 2020-09-17 DIAGNOSIS — R55 Syncope and collapse: Secondary | ICD-10-CM | POA: Insufficient documentation
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
## 2020-09-19 ENCOUNTER — Encounter: Payer: Self-pay | Admitting: Cardiology

## 2020-09-19 ENCOUNTER — Ambulatory Visit (INDEPENDENT_AMBULATORY_CARE_PROVIDER_SITE_OTHER): Payer: Commercial Managed Care - PPO | Admitting: Cardiology

## 2020-09-19 ENCOUNTER — Ambulatory Visit (INDEPENDENT_AMBULATORY_CARE_PROVIDER_SITE_OTHER): Payer: Commercial Managed Care - PPO

## 2020-09-19 ENCOUNTER — Other Ambulatory Visit: Payer: Self-pay

## 2020-09-19 VITALS — BP 122/76 | HR 65 | Ht 64.0 in | Wt 255.6 lb

## 2020-09-19 DIAGNOSIS — R55 Syncope and collapse: Secondary | ICD-10-CM

## 2020-09-19 NOTE — Patient Instructions (Addendum)
Medication Instructions:  Not needed  *If you need a refill on your cardiac medications before your next appointment, please call your pharmacy*   Lab Work:  Not needed   Testing/Procedures: Your physician has recommended that you wear a holter monitor 14 day  ZIO monitor . Holter monitors are medical devices that record the heart's electrical activity. Doctors most often use these monitors to diagnose arrhythmias. Arrhythmias are problems with the speed or rhythm of the heartbeat. The monitor is a small, portable device. You can wear one while you do your normal daily activities. This is usually used to diagnose what is causing palpitations/syncope (passing out).     Follow-Up: At [facility], you and your health needs are our priority.  As part of our continuing mission to provide you with exceptional heart care, we have created designated Provider Care Teams.  These Care Teams include your primary Cardiologist (physician) and Advanced Practice Providers (APPs -  Physician Assistants and Nurse Practitioners) who all work together to provide you with the care you need, when you need it.     Your next appointment:      as needed if monitor report is negative  The format for your next appointment:   In Person  Provider:   Rollene Rotunda, MD   Other Instructions  Your physician discussed the importance of regular exercise and recommended that you start or continue a regular exercise program for good health.  Keep hydrated    ZIO XT- Long Term Monitor Instructions   Your physician has requested you wear a ZIO patch monitor for _14__ days.  This is a single patch monitor.   [employer] supplies one patch monitor per enrollment. Additional stickers are not available. Please do not apply patch if you will be having a Nuclear Stress Test, Echocardiogram, Cardiac CT, MRI, or Chest Xray during the period you would be wearing the monitor. The patch cannot be worn during these tests.  You cannot remove and re-apply the ZIO XT patch monitor.  Your ZIO patch monitor will be sent Fed Ex from [employer] directly to your home address. It may take 3-5 days to receive your monitor after you have been enrolled.  Once you have received your monitor, please review the enclosed instructions. Your monitor has already been registered assigning a specific monitor serial # to you.  Billing and Patient Assistance Program Information   We have supplied [employer] with any of your insurance information on file for billing purposes. [employer] offers a sliding scale Patient Assistance Program for patients that do not have insurance, or whose insurance does not completely cover the cost of the ZIO monitor.   You must apply for the Patient Assistance Program to qualify for this discounted rate.     To apply, please call [employer] at [phone], select option 4, then select option 2, and ask to apply for Patient Assistance Program.  Meredeth Ide will ask your household income, and how many people are in your household.  They will quote your out-of-pocket cost based on that information.  [employer] will also be able to set up a 59-month, interest-free payment plan if needed.  Applying the monitor   Shave hair from upper left chest.  Hold abrader disc by orange tab. Rub abrader in 40 strokes over the upper left chest as indicated in your monitor instructions.  Clean area with 4 enclosed alcohol pads. Let dry.  Apply patch as indicated in monitor instructions. Patch will be placed under collarbone on left  side of chest with arrow pointing upward.  Rub patch adhesive wings for 2 minutes. Remove white label marked "1". Remove the white label marked "2". Rub patch adhesive wings for 2 additional minutes.  While looking in a mirror, press and release button in center of patch. A small green light will flash 3-4 times. This will be your only indicator that the monitor has been turned on. ?  Do not shower for the  first 24 hours. You may shower after the first 24 hours.  Press the button if you feel a symptom. You will hear a small click. Record Date, Time and Symptom in the Patient Logbook.  When you are ready to remove the patch, follow instructions on the last 2 pages of the Patient Logbook. Stick patch monitor onto the last page of Patient Logbook.  Place Patient Logbook in the blue and white box.  Use locking tab on box and tape box closed securely.  The blue and white box has prepaid postage on it. Please place it in the mailbox as soon as possible. Your physician should have your test results approximately 7 days after the monitor has been mailed back to [facility].  Call [facility] Customer Care at [phone] if you have questions regarding your ZIO XT patch monitor. Call them immediately if you see an orange light blinking on your monitor.  If your monitor falls off in less than 4 days, contact our Monitor department at [phone]. ?If your monitor becomes loose or falls off after 4 days call [employer] at [phone] for suggestions on securing your monitor.?

## 2020-09-19 NOTE — Progress Notes (Unsigned)
Patient enrolled for [employer] to ship a 14 day ZIO XT monitor to her home. 

## 2020-10-01 DIAGNOSIS — R55 Syncope and collapse: Secondary | ICD-10-CM | POA: Diagnosis not present

## 2020-10-27 ENCOUNTER — Encounter: Payer: Self-pay | Admitting: *Deleted

## 2020-10-27 ENCOUNTER — Telehealth: Payer: Self-pay | Admitting: Family Medicine

## 2020-10-27 NOTE — Telephone Encounter (Signed)
She needs a letter of clearance from cardiology

## 2020-10-27 NOTE — Telephone Encounter (Signed)
Responded via MyChart message.

## 2020-11-01 ENCOUNTER — Encounter: Payer: Self-pay | Admitting: Family Medicine

## 2020-11-02 ENCOUNTER — Encounter: Payer: Self-pay | Admitting: Family Medicine

## 2020-11-02 NOTE — Telephone Encounter (Signed)
I sent a letter to her my chart clearing her for all duties.

## 2020-11-11 ENCOUNTER — Ambulatory Visit (INDEPENDENT_AMBULATORY_CARE_PROVIDER_SITE_OTHER): Payer: Commercial Managed Care - PPO | Admitting: Nurse Practitioner

## 2020-11-11 ENCOUNTER — Other Ambulatory Visit: Payer: Self-pay

## 2020-11-11 ENCOUNTER — Encounter: Payer: Self-pay | Admitting: *Deleted

## 2020-11-11 ENCOUNTER — Telehealth: Payer: Self-pay | Admitting: Family Medicine

## 2020-11-11 ENCOUNTER — Ambulatory Visit (INDEPENDENT_AMBULATORY_CARE_PROVIDER_SITE_OTHER): Payer: Commercial Managed Care - PPO

## 2020-11-11 VITALS — BP 122/76 | HR 72 | Temp 97.9°F | Ht 64.0 in | Wt 260.0 lb

## 2020-11-11 DIAGNOSIS — S3992XA Unspecified injury of lower back, initial encounter: Secondary | ICD-10-CM | POA: Diagnosis not present

## 2020-11-11 DIAGNOSIS — W19XXXA Unspecified fall, initial encounter: Secondary | ICD-10-CM | POA: Diagnosis not present

## 2020-11-11 DIAGNOSIS — M5459 Other low back pain: Secondary | ICD-10-CM | POA: Diagnosis not present

## 2020-11-11 MED ORDER — HYDROCODONE-ACETAMINOPHEN 5-325 MG PO TABS: 1.0000 | ORAL_TABLET | Freq: Four times a day (QID) | ORAL | 0 refills | Status: AC | PRN

## 2020-11-11 NOTE — Telephone Encounter (Signed)
Note done and pt will get from MyChart

## 2020-11-11 NOTE — Patient Instructions (Signed)
Tailbone Injury The tailbone is the small bone at the lower end of the backbone (spine). The tailbone can become injured from: A fall. Sitting to row or bike for a long time. Having a baby. This type of injury can be painful. Most tailbone injuries get better on their own in 4-6 weeks. Follow these instructions at home: Activity Avoid sitting in one place for a long time. Wear proper pads and gear when riding a bike or rowing. Increase your activity as the pain allows. Do exercises as told by your doctor or physical therapist. Managing pain, stiffness, and swelling To lessen pain: Sit on a large, rubber or inflated ring or cushion. Lean forward when you sit. If told, apply ice to the injured area. Put ice in a plastic bag. Place a towel between your skin and the bag. Leave the ice on for 20 minutes, 2-3 times per day. Do this for the first 1-2 days. If told, put heat on the injured area. Do this as often as told by your doctor. Use the heat source that your doctor recommends, such as a moist heat pack or a heating pad. Place a towel between your skin and the heat source. Leave the heat on for 20-30 minutes. Remove the heat if your skin turns bright red. This is very important if you are unable to feel pain, heat, or cold. You may have a greater risk of getting burned. General instructions Take over-the-counter and prescription medicines only as told by your doctor. To prevent or treat trouble pooping (constipation) or pain when pooping, your doctor may suggest that you: Drink enough fluid to keep your pee (urine) pale yellow. Eat foods that are high in fiber. These include fresh fruits and vegetables, whole grains, and beans. Limit foods that are high in fat and sugar. These include fried and sweet foods. Take an over-the-counter or prescription medicine to treat trouble pooping. Keep all follow-up visits as told by your doctor. This is important. Contact a doctor if: Your pain gets  worse. Pooping causes you pain. You cannot poop after 4 days. You have pain during sex. Summary A tailbone injury can happen from a fall, from sitting for a long time to row or bike, or after having a baby. These injuries can be painful. Most tailbone injuries get better on their own in 4-6 weeks. Sit on a large, rubber or inflated ring or cushion to lessen pain. Avoid sitting in one place for a long time. Follow your doctor's suggestions to prevent or treat trouble pooping. This information is not intended to replace advice given to you by your health care provider. Make sure you discuss any questions you have with your health care provider. Document Revised: 05/14/2017 Document Reviewed: 05/14/2017 Elsevier Patient Education  2022 [employer] Inc.  

## 2020-11-11 NOTE — Assessment & Plan Note (Signed)
Patient fell and hit her tailbone on a water park with daughter 24 hours ago.  Patient in excruciating pain and rates it as severe 9-10 out of 10 especially when sitting. Advised patient to use ice for the next 24 hours, switch to warm compress as tolerated, anti-inflammatory, do not pillow, Norco given for 5 days.  Allow time to heal bone.  Completed coccyx x-ray results not show any broken tailbone.   Rx sent to pharmacy.  Education provided to patient printed handouts given.  Follow-up with worsening or unresolved symptoms.

## 2020-11-11 NOTE — Progress Notes (Signed)
Acute Office Visit  Subjective:    Patient ID: Joan Ball, female    DOB: Jun 14, 1990, 30 y.o.   MRN: [id number]  Chief Complaint  Patient presents with   Tailbone Pain    Fall The accident occurred 12 to 24 hours ago. The fall occurred while standing. She fell from an unknown height. She landed on Hard floor. There was no blood loss. The point of impact was the buttocks. The pain is present in the buttocks. The pain is at a severity of 9/10. The pain is severe. The symptoms are aggravated by sitting. She has tried acetaminophen for the symptoms. The treatment provided no relief.    Past Medical History:  Diagnosis Date   Allergic reaction to alpha-gal    Allergy    Asthma    as a child   Pneumonia due to COVID-19 virus     Past Surgical History:  Procedure Laterality Date   TONSILLECTOMY      Family History  Problem Relation Age of Onset   Fibromyalgia Mother    Depression Mother    Hyperlipidemia Mother    Allergic rhinitis Mother    Cancer Maternal Grandmother    Cancer Maternal Grandfather    Diabetes Maternal Grandfather    Heart disease Maternal Grandfather    Heart disease Paternal Grandmother    Heart disease Paternal Grandfather    Diabetes Paternal Grandfather    Urticaria Neg Hx    Eczema Neg Hx     Social History   Socioeconomic History   Marital status: Married    Spouse name: Not on file   Number of children: Not on file   Years of education: Not on file   Highest education level: Not on file  Occupational History   Not on file  Tobacco Use   Smoking status: Never   Smokeless tobacco: Never  Vaping Use   Vaping Use: Never used  Substance and Sexual Activity   Alcohol use: No   Drug use: No   Sexual activity: Yes  Other Topics Concern   Not on file  Social History Narrative   Married.  Lives at home with wife and 58 year old son.     Social Determinants of Health   Financial Resource Strain: Not on file  Food Insecurity: Not  on file  Transportation Needs: Not on file  Physical Activity: Not on file  Stress: Not on file  Social Connections: Not on file  Intimate Partner Violence: Not on file    Outpatient Medications Prior to Visit  Medication Sig Dispense Refill   albuterol (PROVENTIL HFA;VENTOLIN HFA) 108 (90 Base) MCG/ACT inhaler Inhale 2 puffs into the lungs every 6 (six) hours as needed for wheezing or shortness of breath. 1 Inhaler 0   azelastine (ASTELIN) 0.1 % nasal spray Place 1 spray into both nostrils 2 (two) times daily. Use in each nostril as directed 30 mL 11   EPINEPHrine (AUVI-Q) 0.3 mg/0.3 mL IJ SOAJ injection Use as directed for severe allergic reaction 2 Device 1   meclizine (ANTIVERT) 25 MG tablet Take 1 tablet (25 mg total) by mouth 3 (three) times daily as needed for dizziness or nausea. 60 tablet 5   No facility-administered medications prior to visit.    Allergies  Allergen Reactions   Alpha-Gal Hives   Advil [Ibuprofen]     Hives    Peach [Prunus Persica]    Amoxicillin Rash    Other Other    Penicillin G Rash  Other Other     Review of Systems  Constitutional: Negative.   HENT: Negative.    Eyes: Negative.   Respiratory: Negative.    Cardiovascular: Negative.   Gastrointestinal: Negative.   Genitourinary: Negative.   Musculoskeletal:  Positive for back pain.  Skin:  Negative for rash.  All other systems reviewed and are negative.     Objective:    Physical Exam Vitals and nursing note reviewed.  Constitutional:      Appearance: Normal appearance.  HENT:     Head: Normocephalic.     Nose: Nose normal.  Eyes:     Conjunctiva/sclera: Conjunctivae normal.  Pulmonary:     Effort: Pulmonary effort is normal.     Breath sounds: Normal breath sounds.  Abdominal:     General: Bowel sounds are normal.  Musculoskeletal:        General: Tenderness and signs of injury present.       Back:     Comments: Coccyx (tailbone) injury from a fall  Skin:     Findings: No rash.  Neurological:     Mental Status: She is alert and oriented to person, place, and time.    BP 122/76   Pulse 72   Temp 97.9 F (36.6 C) (Temporal)   Ht 5' 4"  (1.626 m)   Wt 260 lb (117.9 kg)   SpO2 98%   BMI 44.63 kg/m  Wt Readings from Last 3 Encounters:  11/11/20 260 lb (117.9 kg)  09/19/20 255 lb 9.6 oz (115.9 kg)  09/15/20 251 lb 6.4 oz (114 kg)    Health Maintenance Due  Topic Date Due   COVID-19 Vaccine (1) Never done   HIV Screening  Never done   Hepatitis C Screening  Never done   PAP-Cervical Cytology Screening  Never done   PAP SMEAR-Modifier  Never done    There are no preventive care reminders to display for this patient.   Lab Results  Component Value Date   TSH 3.720 06/27/2017   Lab Results  Component Value Date   WBC 12.9 (H) 09/13/2020   HGB 12.2 09/13/2020   HCT 40.2 09/13/2020   MCV 78.2 (L) 09/13/2020   PLT 341 09/13/2020   Lab Results  Component Value Date   NA 140 09/13/2020   K 3.7 09/13/2020   CO2 24 09/13/2020   GLUCOSE 93 09/13/2020   BUN 6 09/13/2020   CREATININE 0.76 09/13/2020   BILITOT 0.5 09/13/2020   ALKPHOS 67 09/13/2020   AST 24 09/13/2020   ALT 20 09/13/2020   PROT 7.6 09/13/2020   ALBUMIN 4.0 09/13/2020   CALCIUM 9.5 09/13/2020   ANIONGAP 8 09/13/2020   EGFR 122 08/08/2020   Lab Results  Component Value Date   CHOL 175 08/08/2020   Lab Results  Component Value Date   HDL 28 (L) 08/08/2020   Lab Results  Component Value Date   LDLCALC 92 08/08/2020   Lab Results  Component Value Date   TRIG 326 (H) 08/08/2020   Lab Results  Component Value Date   CHOLHDL 6.3 (H) 08/08/2020   No results found for: HGBA1C     Assessment & Plan:   Problem List Items Addressed This Visit       Other   Tailbone injury, initial encounter    Patient fell and hit her tailbone on a water park with daughter 24 hours ago.  Patient in excruciating pain and rates it as severe 9-10 out of 10  especially when  sitting. Advised patient to use ice for the next 24 hours, switch to warm compress as tolerated, anti-inflammatory, do not pillow, Norco given for 5 days.  Allow time to heal bone.  Completed coccyx x-ray results not show any broken tailbone.   Rx sent to pharmacy.  Education provided to patient printed handouts given.  Follow-up with worsening or unresolved symptoms.          Relevant Medications   HYDROcodone-acetaminophen (NORCO) 5-325 MG tablet   Other Visit Diagnoses     Fall, initial encounter    -  Primary   Relevant Orders   DG Sacrum/Coccyx        Meds ordered this encounter  Medications   HYDROcodone-acetaminophen (NORCO) 5-325 MG tablet    Sig: Take 1 tablet by mouth every 6 (six) hours as needed for up to 5 days for moderate pain.    Dispense:  20 tablet    Refill:  0    Order Specific Question:   Supervising Provider    Answer:   Janora Norlander [[id number]]     Ivy Lynn, NP

## 2020-12-22 NOTE — Congregational Nurse Program (Signed)
Conducted  BP health screening per request of health fair attendee per her stating concern while attending the [employer].  BP  ABN)   Advised to  self monitor BP and obtain BP monitors at home for self monitoring purposes and to be able to report to medical provider during any upcoming wellness visits.    In addtion, was advised to download a BP app (ex. Derenda Mis home) to record bp reading to submit to provider prior to wellness visits.    Pt was provided educational material and counseled on ways to improve and prevent hypertension and diabetes.

## 2021-06-20 ENCOUNTER — Telehealth: Payer: Self-pay | Admitting: Family Medicine

## 2021-06-20 NOTE — Telephone Encounter (Signed)
Pt. Needs to be seen for this. Thanks, WS 

## 2021-06-20 NOTE — Telephone Encounter (Signed)
Patient has a form that she needs filled out stating that she is exempt from taking the covid and flu shot because of medical reason for an employment opportunity. She will need this form completed by 2/24 and is aware that she may need an appt. PCP booked at this time. Please call back.

## 2021-06-21 NOTE — Telephone Encounter (Signed)
Mychart message sent.

## 2021-06-26 ENCOUNTER — Encounter: Payer: Self-pay | Admitting: Family Medicine

## 2021-06-26 ENCOUNTER — Ambulatory Visit (INDEPENDENT_AMBULATORY_CARE_PROVIDER_SITE_OTHER): Payer: Commercial Managed Care - PPO | Admitting: Family Medicine

## 2021-06-26 VITALS — BP 120/70 | HR 68 | Temp 97.8°F | Ht 64.0 in | Wt 262.8 lb

## 2021-06-26 DIAGNOSIS — T50Z95A Adverse effect of other vaccines and biological substances, initial encounter: Secondary | ICD-10-CM

## 2021-06-26 DIAGNOSIS — T50Z95S Adverse effect of other vaccines and biological substances, sequela: Secondary | ICD-10-CM

## 2021-06-26 DIAGNOSIS — T50Z95D Adverse effect of other vaccines and biological substances, subsequent encounter: Secondary | ICD-10-CM | POA: Diagnosis not present

## 2021-06-26 DIAGNOSIS — T781XXA Other adverse food reactions, not elsewhere classified, initial encounter: Secondary | ICD-10-CM

## 2021-06-26 NOTE — Progress Notes (Signed)
Subjective:  Patient ID: Joan Ball, female    DOB: [birth year]  Age: 31 y.o. MRN: [id number]  CC: No chief complaint on file.   HPI [name] presents for Pt. Had reaction to varicella vaccine in the past. She is now concerned about taking future vax. 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  The reaction was that the  Site became very swollen, covering most of the upper arm. Hands swollen as well. . Followed by fatigue and rash on torso. Fever not documented. Also has alpha gal syndrome that has contraindicated vaccines.   Depression screen [facility] 2/9 06/26/2021 11/11/2020 09/15/2020  Decreased Interest 0 0 0  Down, Depressed, Hopeless 0 0 0  PHQ - 2 Score 0 0 0    History [name] has a past medical history of Allergic reaction to alpha-gal, Allergy, Asthma, and Pneumonia due to COVID-19 virus.   She has a past surgical history that includes Tonsillectomy.   Her family history includes Allergic rhinitis in her mother; Cancer in her maternal grandfather and maternal grandmother; Depression in her mother; Diabetes in her maternal grandfather and paternal grandfather; Fibromyalgia in her mother; Heart disease in her maternal grandfather, paternal grandfather, and paternal grandmother; Hyperlipidemia in her mother.She reports that she has never smoked. She has never used smokeless tobacco. She reports that she does not drink alcohol and does not use drugs.    ROS Review of Systems  Constitutional: Negative.   HENT: Negative.     Objective:  BP 120/70    Pulse 68    Temp 97.8 F (36.6 C)    Ht 5\' 4"  (1.626 m)    Wt 262 lb 12.8 oz (119.2 kg)    SpO2 97%    BMI 45.11 kg/m   BP Readings from Last 3 Encounters:  06/26/21 120/70  12/22/20 (!) 143/79  11/11/20 122/76    Wt Readings from Last 3 Encounters:  06/26/21 262 lb 12.8 oz (119.2 kg)  11/11/20 260 lb (117.9 kg)  09/19/20 255 lb 9.6  oz (115.9 kg)     Physical Exam Constitutional:      General: She is not in acute distress.    Appearance: Normal appearance. She is obese.  Neurological:     Mental Status: She is alert.      Assessment & Plan:   Diagnoses and all orders for this visit:  Vaccine reaction, sequela  Allergic reaction to alpha-gal       I am having Jakiyah Tomasello maintain her EPINEPHrine, azelastine, albuterol, and meclizine.  Allergies as of 06/26/2021       Reactions   Alpha-gal Hives   Advil [ibuprofen]    Hives   Peach [prunus Persica]    Amoxicillin Rash   Other Other   Penicillin G Rash   Other Other        Medication List        Accurate as of June 26, 2021  8:26 AM. If you have any questions, ask your nurse or doctor.          albuterol 108 (90 Base) MCG/ACT inhaler Commonly known as: VENTOLIN HFA Inhale 2 puffs into the lungs every 6 (six) hours as needed for wheezing or shortness of breath.   azelastine 0.1 % nasal spray Commonly known as: ASTELIN Place 1 spray into both nostrils 2 (two) times daily. Use in each nostril as directed  EPINEPHrine 0.3 mg/0.3 mL Soaj injection Commonly known as: Auvi-Q Use as directed for severe allergic reaction   meclizine 25 MG tablet Commonly known as: ANTIVERT Take 1 tablet (25 mg total) by mouth 3 (three) times daily as needed for dizziness or nausea.       Form completed  Follow-up: Return in about 6 months (around 12/24/2021).  Mechele Claude, M.D.

## 2021-09-12 ENCOUNTER — Encounter: Payer: Self-pay | Admitting: Family Medicine

## 2021-09-12 ENCOUNTER — Ambulatory Visit (INDEPENDENT_AMBULATORY_CARE_PROVIDER_SITE_OTHER): Payer: PRIVATE HEALTH INSURANCE

## 2021-09-12 ENCOUNTER — Ambulatory Visit: Payer: PRIVATE HEALTH INSURANCE | Admitting: Family Medicine

## 2021-09-12 VITALS — BP 109/54 | HR 62 | Temp 97.9°F | Ht 64.0 in | Wt 263.6 lb

## 2021-09-12 DIAGNOSIS — J3089 Other allergic rhinitis: Secondary | ICD-10-CM | POA: Diagnosis not present

## 2021-09-12 DIAGNOSIS — R051 Acute cough: Secondary | ICD-10-CM | POA: Diagnosis not present

## 2021-09-12 DIAGNOSIS — J301 Allergic rhinitis due to pollen: Secondary | ICD-10-CM | POA: Diagnosis not present

## 2021-09-12 DIAGNOSIS — J302 Other seasonal allergic rhinitis: Secondary | ICD-10-CM | POA: Diagnosis not present

## 2021-09-12 MED ORDER — AZELASTINE HCL 0.1 % NA SOLN: 1.0000 | Freq: Two times a day (BID) | NASAL | 11 refills | Status: AC

## 2021-09-12 MED ORDER — BETAMETHASONE SOD PHOS & ACET 6 (3-3) MG/ML IJ SUSP: 6.0000 mg | Freq: Once | INTRAMUSCULAR | Status: AC

## 2021-09-12 MED ORDER — FEXOFENADINE-PSEUDOEPHED ER 180-240 MG PO TB24: 1.0000 | ORAL_TABLET | Freq: Every evening | ORAL | 11 refills | Status: AC

## 2021-09-12 NOTE — Progress Notes (Signed)
? ?Subjective:  ?Patient ID: Joan Ball, female    DOB: June 17, 1990  Age: 31 y.o. MRN: [id number] ? ?CC: Cough ? ? ?HPI ?Echo Propp presents for cough. Started as a tickle. Clearing the throat, especially a lot. Onset 4 days ago. Also has HA. Squeezing at temples. Covid neg X 2 yesterday. A little dyspneic with activity. Maybe a little with laying down. Cough nonproductive. Denies fever, chills. Had sweats at work a few nights ago. She is having rhinorrhea that is clear, watery eyes and sneezing.  ? ?Recently started a new job at [employer]. Planning to move to [place]. ? ? ?  09/12/2021  ?  8:27 AM 06/26/2021  ?  8:01 AM 11/11/2020  ?  9:58 AM  ?Depression screen PHQ 2/9  ?Decreased Interest 0 0 0  ?Down, Depressed, Hopeless 0 0 0  ?PHQ - 2 Score 0 0 0  ? ? ?History ?Latika has a past medical history of Allergic reaction to alpha-gal, Allergy, Asthma, and Pneumonia due to COVID-19 virus.  ? ?She has a past surgical history that includes Tonsillectomy.  ? ?Her family history includes Allergic rhinitis in her mother; Cancer in her maternal grandfather and maternal grandmother; Depression in her mother; Diabetes in her maternal grandfather and paternal grandfather; Fibromyalgia in her mother; Heart disease in her maternal grandfather, paternal grandfather, and paternal grandmother; Hyperlipidemia in her mother.She reports that she has never smoked. She has never used smokeless tobacco. She reports that she does not drink alcohol and does not use drugs. ? ? ? ?ROS ?Review of Systems  ?Constitutional: Negative.   ?HENT: Negative.    ?Eyes:  Negative for visual disturbance.  ?Respiratory:  Positive for shortness of breath (mild - see HPI).   ?Cardiovascular:  Negative for chest pain.  ?Gastrointestinal:  Negative for abdominal pain.  ?Musculoskeletal:  Negative for arthralgias.  ? ?Objective:  ?BP (!) 109/54   Pulse 62   Temp 97.9 ?F (36.6 ?C)   Ht 5\' 4"  (1.626 m)   Wt 263 lb 9.6 oz (119.6 kg)   SpO2 97%   BMI  45.25 kg/m?  ? ?BP Readings from Last 3 Encounters:  ?09/12/21 (!) 109/54  ?06/26/21 120/70  ?12/22/20 (!) 143/79  ? ? ?Wt Readings from Last 3 Encounters:  ?09/12/21 263 lb 9.6 oz (119.6 kg)  ?06/26/21 262 lb 12.8 oz (119.2 kg)  ?11/11/20 260 lb (117.9 kg)  ? ? ? ?Physical Exam ?Constitutional:   ?   General: She is not in acute distress. ?   Appearance: She is well-developed.  ?Cardiovascular:  ?   Rate and Rhythm: Normal rate and regular rhythm.  ?Pulmonary:  ?   Breath sounds: Normal breath sounds.  ?Musculoskeletal:     ?   General: Normal range of motion.  ?Skin: ?   General: Skin is warm and dry.  ?Neurological:  ?   Mental Status: She is alert and oriented to person, place, and time.  ? ? ? ? ?Assessment & Plan:  ? ?Kala was seen today for cough. ? ?Diagnoses and all orders for this visit: ? ?Acute cough ?-     DG Chest 2 View; Future ?-     betamethasone acetate-betamethasone sodium phosphate (CELESTONE) injection 6 mg ? ?Seasonal allergic rhinitis due to pollen ?-     betamethasone acetate-betamethasone sodium phosphate (CELESTONE) injection 6 mg ? ?Seasonal and perennial allergic rhinitis ?-     azelastine (ASTELIN) 0.1 % nasal spray; Place 1 spray into both nostrils 2 (two)  times daily. Use in each nostril as directed ?-     betamethasone acetate-betamethasone sodium phosphate (CELESTONE) injection 6 mg ? ?Other orders ?-     fexofenadine-pseudoephedrine (ALLEGRA-D 24) 180-240 MG 24 hr tablet; Take 1 tablet by mouth every evening. For allergy and congestion ? ? ? ? ? ? ?I am having Dinesha Gowin start on fexofenadine-pseudoephedrine. I am also having her maintain her EPINEPHrine, albuterol, meclizine, and azelastine. We will continue to administer betamethasone acetate-betamethasone sodium phosphate. ? ?Allergies as of 09/12/2021   ? ?   Reactions  ? Alpha-gal Hives  ? Advil [ibuprofen]   ? Hives  ? Peach [prunus Persica]   ? Amoxicillin Rash  ? Other ?Other  ? Penicillin G Rash  ? Other ?Other  ? ?   ? ?  ?Medication List  ?  ? ?  ? Accurate as of Sep 12, 2021  9:51 AM. If you have any questions, ask your nurse or doctor.  ?  ?  ? ?  ? ?albuterol 108 (90 Base) MCG/ACT inhaler ?Commonly known as: VENTOLIN HFA ?Inhale 2 puffs into the lungs every 6 (six) hours as needed for wheezing or shortness of breath. ?  ?azelastine 0.1 % nasal spray ?Commonly known as: ASTELIN ?Place 1 spray into both nostrils 2 (two) times daily. Use in each nostril as directed ?  ?EPINEPHrine 0.3 mg/0.3 mL Soaj injection ?Commonly known as: Auvi-Q ?Use as directed for severe allergic reaction ?  ?fexofenadine-pseudoephedrine 180-240 MG 24 hr tablet ?Commonly known as: ALLEGRA-D 24 ?Take 1 tablet by mouth every evening. For allergy and congestion ?Started by: Mechele Claude, MD ?  ?meclizine 25 MG tablet ?Commonly known as: ANTIVERT ?Take 1 tablet (25 mg total) by mouth 3 (three) times daily as needed for dizziness or nausea. ?  ? ?  ? ? ? ?Follow-up: Return in about 1 month (around 10/13/2021). ? ?Mechele Claude, M.D. ?

## 2021-09-13 NOTE — Progress Notes (Signed)
Your chest x-ray looked normal. Thanks, WS.

## 2022-12-07 IMAGING — DX DG CHEST 2V
2 series · 2 of 2 positions shown · non-contrast
Comparison: September 13, 2020

CLINICAL DATA: Cough.  Dyspnea.

EXAM:
CHEST - 2 VIEW

[chest lat]
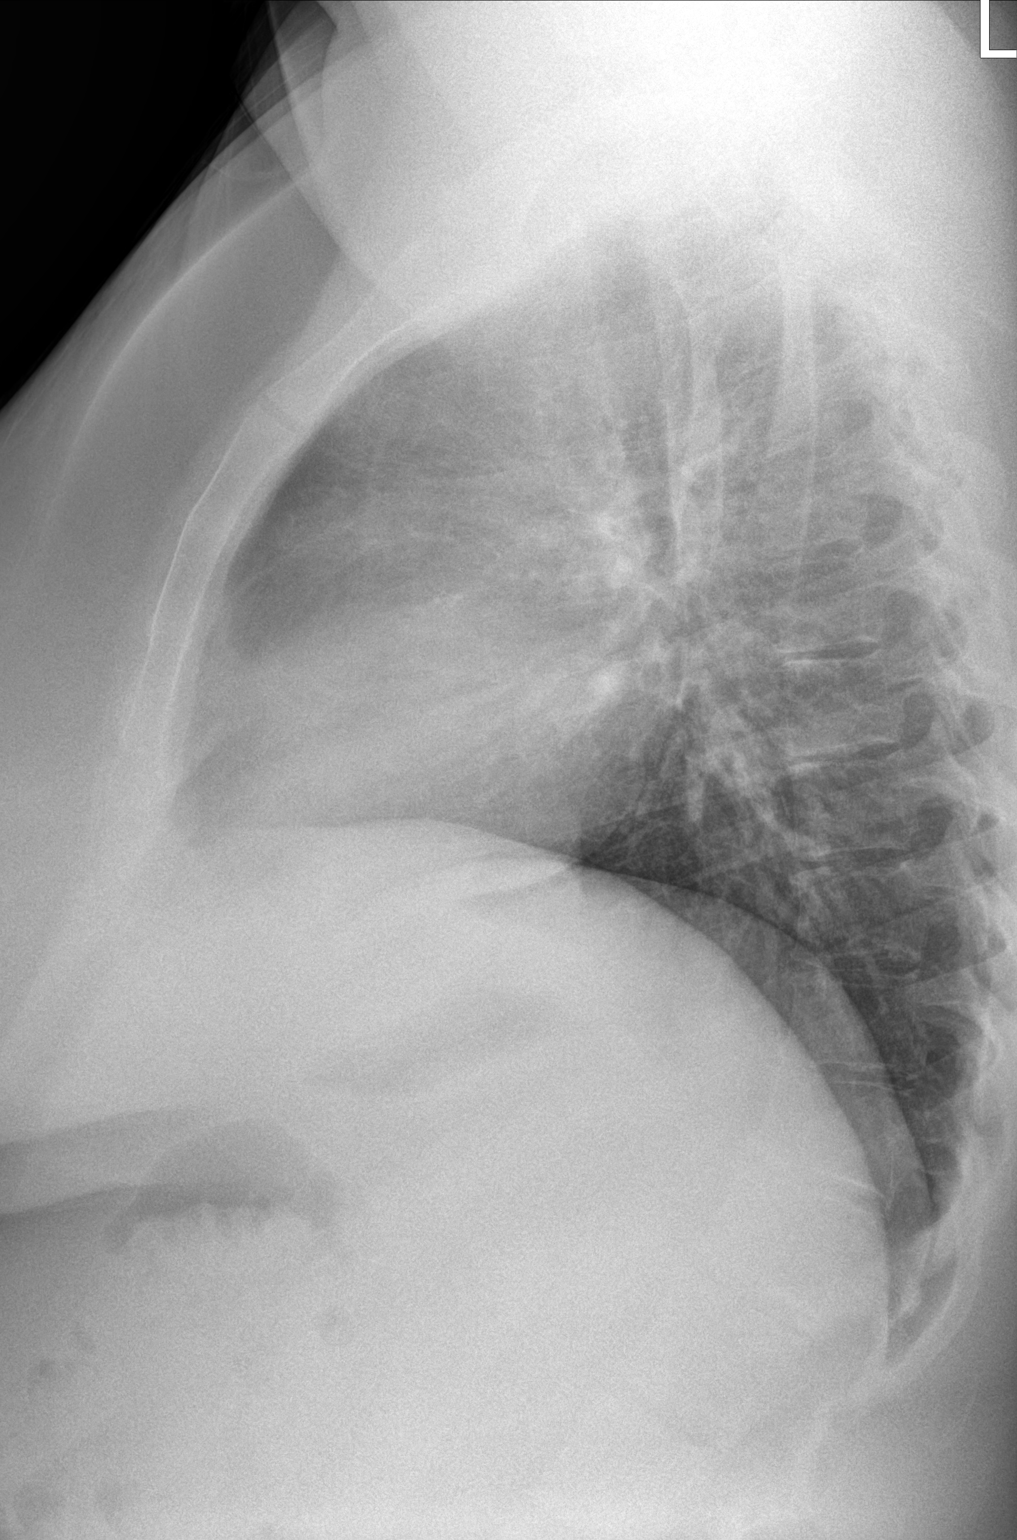

[chest pa]
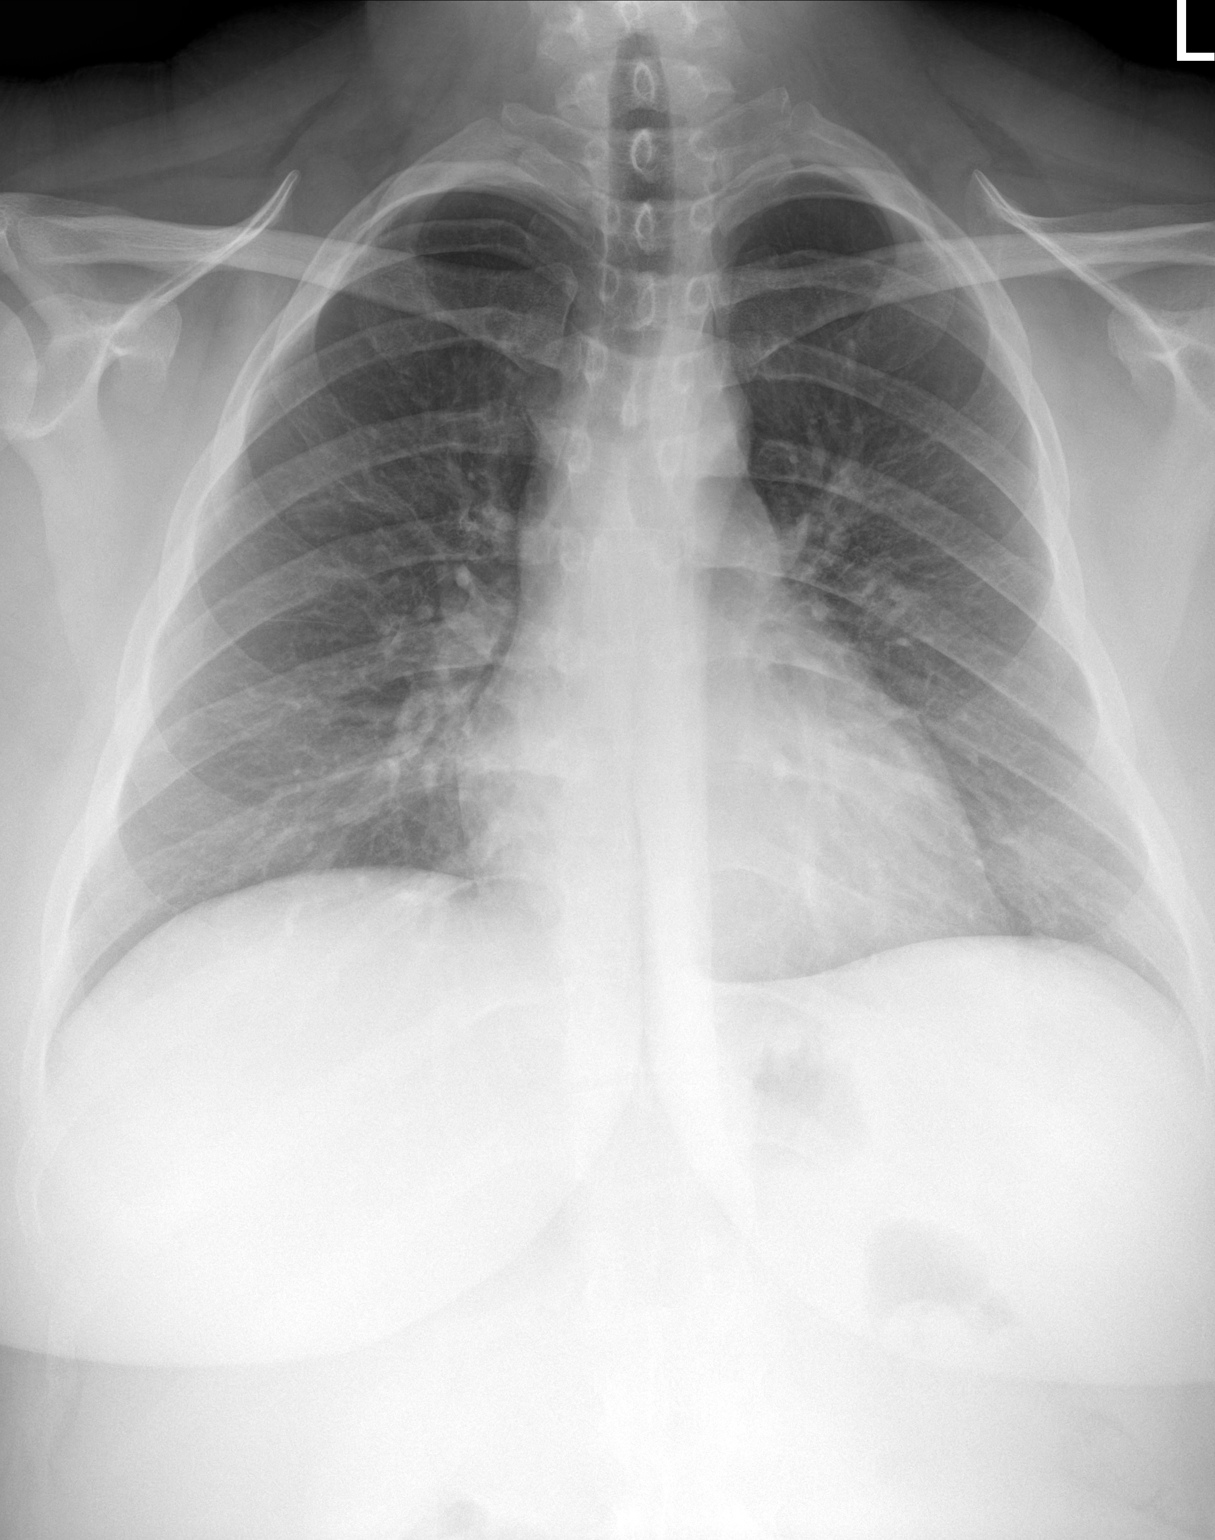

[2 of 2 positions shown; findings below may reference images not displayed]

FINDINGS: The heart size and mediastinal contours are within normal limits.
Both lungs are clear. The visualized skeletal structures are
unremarkable.
IMPRESSION: No active cardiopulmonary disease.

## 2024-02-08 ENCOUNTER — Emergency Department (HOSPITAL_BASED_OUTPATIENT_CLINIC_OR_DEPARTMENT_OTHER)
Admission: EM | Admit: 2024-02-08 | Discharge: 2024-02-08 | Disposition: A | Discharge status: Home / Self Care | Payer: PRIVATE HEALTH INSURANCE | Attending: Emergency Medicine | Admitting: Emergency Medicine

## 2024-02-08 DIAGNOSIS — T7840XA Allergy, unspecified, initial encounter: Secondary | ICD-10-CM | POA: Diagnosis present

## 2024-02-08 MED ORDER — PREDNISONE 20 MG PO TABS: 20.0000 mg | ORAL_TABLET | Freq: Every day | ORAL | 0 refills | Status: AC

## 2024-02-08 MED ORDER — EPINEPHRINE 0.3 MG/0.3ML IJ SOAJ: 0.3000 mg | INTRAMUSCULAR | 0 refills | Status: AC | PRN

## 2024-02-08 MED ORDER — PREDNISONE 20 MG PO TABS
40.0000 mg | ORAL_TABLET | Freq: Once | ORAL | Status: AC
  Administered 2024-02-08: 40 mg via ORAL
  Filled 2024-02-08: qty 2

## 2024-02-08 MED ORDER — FAMOTIDINE 20 MG PO TABS
20.0000 mg | ORAL_TABLET | Freq: Once | ORAL | Status: AC
  Administered 2024-02-08: 20 mg via ORAL
  Filled 2024-02-08: qty 1

## 2024-02-08 NOTE — ED Provider Notes (Signed)
 [place] [facility] AT [facility] Provider Note   CSN: [id number] Arrival date & time: 02/08/24  0501     History Chief Complaint  Patient presents with   Allergic Reaction    HPI Joan Ball is a 33 y.o. female presenting for chief complaint of concern for allergic reaction.  Has been trying to incorporate more dairy in her diet.  Has a history of milk protein allergy but had been told she would grow out of it. Ate dairy late last night.  Having GI symptoms overnight and now hives/SOB/worsening symptoms. Diarrheal illness.    Patient's recorded medical, surgical, social, medication list and allergies were reviewed in the Snapshot window as part of the initial history.   Review of Systems   Review of Systems  Constitutional:  Negative for chills and fever.  HENT:  Negative for ear pain and sore throat.   Eyes:  Negative for pain and visual disturbance.  Respiratory:  Negative for cough and shortness of breath.   Cardiovascular:  Negative for chest pain and palpitations.  Gastrointestinal:  Positive for abdominal distention, diarrhea, nausea and vomiting. Negative for abdominal pain.  Genitourinary:  Negative for dysuria and hematuria.  Musculoskeletal:  Negative for arthralgias and back pain.  Skin:  Negative for color change and rash.  Neurological:  Negative for seizures and syncope.  All other systems reviewed and are negative.   Physical Exam Updated Vital Signs BP 96/69 (BP Location: Right Arm)   Pulse 92   Temp 98 F (36.7 C) (Oral)   Resp 18   LMP 02/05/2024 (Exact Date)   SpO2 99%  Physical Exam Vitals and nursing note reviewed.  Constitutional:      General: She is not in acute distress.    Appearance: She is well-developed.  HENT:     Head: Normocephalic and atraumatic.  Eyes:     Conjunctiva/sclera: Conjunctivae normal.  Cardiovascular:     Rate and Rhythm: Normal rate and regular rhythm.     Heart sounds: No murmur  heard. Pulmonary:     Effort: Pulmonary effort is normal. No respiratory distress.     Breath sounds: Normal breath sounds.  Abdominal:     General: There is no distension.     Palpations: Abdomen is soft.     Tenderness: There is abdominal tenderness. There is no right CVA tenderness or left CVA tenderness.  Musculoskeletal:        General: No swelling or tenderness. Normal range of motion.     Cervical back: Neck supple.  Skin:    General: Skin is warm and dry.  Neurological:     General: No focal deficit present.     Mental Status: She is alert and oriented to person, place, and time. Mental status is at baseline.     Cranial Nerves: No cranial nerve deficit.      ED Course/ Medical Decision Making/ A&P    Procedures Procedures   Medications Ordered in ED Medications  predniSONE  (DELTASONE ) tablet 40 mg (40 mg Oral Given 02/08/24 0546)  famotidine (PEPCID) tablet 20 mg (20 mg Oral Given 02/08/24 0546)    Medical Decision Making:    Medical Decision Making:   Shontay Wallner is a 33 y.o. female who presented to the ED today with rash,hives, pharyngeal irritation, GI Upset, respiratory symptoms; detailed above.    Patient placed on continuous vitals and telemetry monitoring while in ED which was reviewed periodically.  Complete initial physical exam performed, notably the patient  was HDS .    Reviewed and confirmed nursing documentation for past medical history, family history, social history.    Initial Assessment/plan:   Patient's constellation of symptoms above are most consistent with an allergic reaction. Considered anaphylactic reaction less likely given only single system of involvement and localized symptoms.  Initial Plan:  Symptomatic management with histamine blockade including diphenhydramine, famotidine and steroid.  Patient will be observed for progression of disease despite administration of medication.    Reassessment and Plan:   After observation  window, patient is grossly improved. Discussed ongoing supportive care and management with medications in the outpatient setting and patient feels comfortable with discharge.  Disposition:  I have considered need for hospitalization, however, considering all of the above, I believe this patient is stable for discharge at this time.  Patient/family educated about specific return precautions for given chief complaint and symptoms.  Patient/family educated about follow-up with PCP.     Patient/family expressed understanding of return precautions and need for follow-up. Patient spoken to regarding all imaging and laboratory results and appropriate follow up for these results. All education provided in verbal form with additional information in written form. Time was allowed for answering of patient questions. Patient discharged.    Emergency Department Medication Summary:   Medications  predniSONE  (DELTASONE ) tablet 40 mg (40 mg Oral Given 02/08/24 0546)  famotidine (PEPCID) tablet 20 mg (20 mg Oral Given 02/08/24 0546)         Clinical Impression:  1. Allergic disorder, initial encounter      Discharge   Final Clinical Impression(s) / ED Diagnoses Final diagnoses:  Allergic disorder, initial encounter    Rx / DC Orders ED Discharge Orders          Ordered    predniSONE  (DELTASONE ) 20 MG tablet  Daily        02/08/24 0534    EPINEPHrine  0.3 mg/0.3 mL IJ SOAJ injection  As needed        02/08/24 0534              Jerral Meth, MD 02/08/24 [phone]

## 2024-02-08 NOTE — ED Triage Notes (Signed)
 Pt states that she woke up at 0400 with loose BM and itching and hives, as well as, SOB. Pt has Alpha-Gam and thinks that reaction is to dairy, specifically pizza that she ate last night. Pt reports that she is feeling better now after taking Benadryl 50 mg prior to arrival.
